# Patient Record
Sex: Male | Born: 1935 | Race: White | Hispanic: No | Smoking: Former smoker
Health system: Southern US, Community
[De-identification: ages and names within clinical notes are randomized; demographics above are authoritative.]

## PROBLEM LIST (undated history)

## (undated) DIAGNOSIS — J189 Pneumonia, unspecified organism: Secondary | ICD-10-CM

## (undated) DIAGNOSIS — J449 Chronic obstructive pulmonary disease, unspecified: Principal | ICD-10-CM

## (undated) DIAGNOSIS — K5901 Slow transit constipation: Secondary | ICD-10-CM

## (undated) DIAGNOSIS — M1710 Unilateral primary osteoarthritis, unspecified knee: Secondary | ICD-10-CM

## (undated) DIAGNOSIS — R609 Edema, unspecified: Secondary | ICD-10-CM

## (undated) DIAGNOSIS — C801 Malignant (primary) neoplasm, unspecified: Secondary | ICD-10-CM

## (undated) DIAGNOSIS — M1990 Unspecified osteoarthritis, unspecified site: Secondary | ICD-10-CM

## (undated) DIAGNOSIS — G4700 Insomnia, unspecified: Secondary | ICD-10-CM

## (undated) DIAGNOSIS — G909 Disorder of the autonomic nervous system, unspecified: Secondary | ICD-10-CM

## (undated) DIAGNOSIS — I10 Essential (primary) hypertension: Secondary | ICD-10-CM

## (undated) DIAGNOSIS — C61 Malignant neoplasm of prostate: Secondary | ICD-10-CM

## (undated) DIAGNOSIS — C8589 Other specified types of non-Hodgkin lymphoma, extranodal and solid organ sites: Secondary | ICD-10-CM

## (undated) DIAGNOSIS — D518 Other vitamin B12 deficiency anemias: Secondary | ICD-10-CM

## (undated) DIAGNOSIS — R222 Localized swelling, mass and lump, trunk: Secondary | ICD-10-CM

## (undated) HISTORY — DX: Insomnia, unspecified: G47.00

## (undated) HISTORY — DX: Slow transit constipation: K59.01

## (undated) HISTORY — DX: Other specified types of non-hodgkin lymphoma, extranodal and solid organ sites: C85.89

## (undated) HISTORY — DX: Edema, unspecified: R60.9

## (undated) HISTORY — DX: Other vitamin B12 deficiency anemias: D51.8

## (undated) HISTORY — DX: Localized swelling, mass and lump, trunk: R22.2

## (undated) HISTORY — DX: Malignant (primary) neoplasm, unspecified: C80.1

## (undated) HISTORY — DX: Disorder of the autonomic nervous system, unspecified: G90.9

## (undated) HISTORY — DX: Chronic obstructive pulmonary disease, unspecified: J44.9

## (undated) HISTORY — DX: Unilateral primary osteoarthritis, unspecified knee: M17.10

## (undated) HISTORY — DX: Unspecified osteoarthritis, unspecified site: M19.90

## (undated) HISTORY — PX: HEMORRHOID SURGERY: SHX153

## (undated) HISTORY — PX: CATARACT EXTRACTION, BILATERAL: SHX1313

## (undated) HISTORY — PX: OTHER SURGICAL HISTORY: SHX169

## (undated) HISTORY — DX: Essential (primary) hypertension: I10

---

## 1997-08-03 ENCOUNTER — Encounter: Admission: RE | Admit: 1997-08-03 | Discharge: 1997-11-01 | Payer: Self-pay | Admitting: Internal Medicine

## 2004-03-21 ENCOUNTER — Ambulatory Visit: Payer: Self-pay | Admitting: Internal Medicine

## 2004-04-04 ENCOUNTER — Ambulatory Visit: Payer: Self-pay | Admitting: Family Medicine

## 2004-06-16 ENCOUNTER — Ambulatory Visit: Payer: Self-pay | Admitting: Internal Medicine

## 2004-06-19 ENCOUNTER — Emergency Department (HOSPITAL_COMMUNITY): Admission: EM | Admit: 2004-06-19 | Discharge: 2004-06-19 | Payer: Self-pay | Admitting: Family Medicine

## 2004-06-20 ENCOUNTER — Ambulatory Visit: Payer: Self-pay | Admitting: Internal Medicine

## 2004-06-29 ENCOUNTER — Ambulatory Visit: Payer: Self-pay | Admitting: Internal Medicine

## 2004-07-04 ENCOUNTER — Ambulatory Visit: Payer: Self-pay | Admitting: Internal Medicine

## 2004-07-06 ENCOUNTER — Ambulatory Visit: Payer: Self-pay | Admitting: Internal Medicine

## 2004-07-21 ENCOUNTER — Ambulatory Visit: Payer: Self-pay | Admitting: Internal Medicine

## 2004-09-13 ENCOUNTER — Ambulatory Visit: Payer: Self-pay | Admitting: Internal Medicine

## 2004-11-14 ENCOUNTER — Ambulatory Visit: Payer: Self-pay | Admitting: Internal Medicine

## 2005-01-16 ENCOUNTER — Ambulatory Visit: Payer: Self-pay | Admitting: Internal Medicine

## 2005-02-21 ENCOUNTER — Ambulatory Visit: Payer: Self-pay | Admitting: Internal Medicine

## 2005-03-06 ENCOUNTER — Ambulatory Visit (HOSPITAL_COMMUNITY): Admission: RE | Admit: 2005-03-06 | Discharge: 2005-03-06 | Payer: Self-pay | Admitting: Urology

## 2005-04-17 ENCOUNTER — Ambulatory Visit: Payer: Self-pay | Admitting: Internal Medicine

## 2005-07-17 ENCOUNTER — Ambulatory Visit: Payer: Self-pay | Admitting: Internal Medicine

## 2005-10-12 ENCOUNTER — Ambulatory Visit: Payer: Self-pay | Admitting: Internal Medicine

## 2005-12-12 ENCOUNTER — Ambulatory Visit: Payer: Self-pay | Admitting: Internal Medicine

## 2006-02-15 ENCOUNTER — Ambulatory Visit: Payer: Self-pay | Admitting: Internal Medicine

## 2006-04-17 ENCOUNTER — Ambulatory Visit: Payer: Self-pay | Admitting: Internal Medicine

## 2006-06-20 ENCOUNTER — Ambulatory Visit: Payer: Self-pay | Admitting: Internal Medicine

## 2006-08-22 ENCOUNTER — Ambulatory Visit: Payer: Self-pay | Admitting: Internal Medicine

## 2006-11-26 DIAGNOSIS — I10 Essential (primary) hypertension: Secondary | ICD-10-CM | POA: Insufficient documentation

## 2006-11-26 HISTORY — DX: Essential (primary) hypertension: I10

## 2006-12-03 ENCOUNTER — Encounter: Payer: Self-pay | Admitting: Internal Medicine

## 2006-12-06 ENCOUNTER — Ambulatory Visit: Payer: Self-pay | Admitting: Internal Medicine

## 2006-12-06 DIAGNOSIS — J4489 Other specified chronic obstructive pulmonary disease: Secondary | ICD-10-CM | POA: Insufficient documentation

## 2006-12-06 DIAGNOSIS — D518 Other vitamin B12 deficiency anemias: Secondary | ICD-10-CM

## 2006-12-06 DIAGNOSIS — M1990 Unspecified osteoarthritis, unspecified site: Secondary | ICD-10-CM | POA: Insufficient documentation

## 2006-12-06 DIAGNOSIS — J449 Chronic obstructive pulmonary disease, unspecified: Secondary | ICD-10-CM

## 2006-12-06 DIAGNOSIS — Z8546 Personal history of malignant neoplasm of prostate: Secondary | ICD-10-CM

## 2006-12-06 HISTORY — DX: Other specified chronic obstructive pulmonary disease: J44.89

## 2006-12-06 HISTORY — DX: Unspecified osteoarthritis, unspecified site: M19.90

## 2006-12-06 HISTORY — DX: Chronic obstructive pulmonary disease, unspecified: J44.9

## 2006-12-06 HISTORY — DX: Other vitamin B12 deficiency anemias: D51.8

## 2006-12-12 ENCOUNTER — Ambulatory Visit: Payer: Self-pay | Admitting: Internal Medicine

## 2006-12-12 DIAGNOSIS — M1710 Unilateral primary osteoarthritis, unspecified knee: Secondary | ICD-10-CM | POA: Insufficient documentation

## 2006-12-12 HISTORY — DX: Unilateral primary osteoarthritis, unspecified knee: M17.10

## 2006-12-19 ENCOUNTER — Ambulatory Visit: Payer: Self-pay | Admitting: Internal Medicine

## 2007-01-02 ENCOUNTER — Ambulatory Visit: Payer: Self-pay | Admitting: Internal Medicine

## 2007-02-19 ENCOUNTER — Ambulatory Visit: Payer: Self-pay | Admitting: Internal Medicine

## 2007-04-09 ENCOUNTER — Encounter: Payer: Self-pay | Admitting: Internal Medicine

## 2007-05-13 ENCOUNTER — Telehealth: Payer: Self-pay | Admitting: Internal Medicine

## 2007-05-13 ENCOUNTER — Emergency Department (HOSPITAL_COMMUNITY): Admission: EM | Admit: 2007-05-13 | Discharge: 2007-05-13 | Payer: Self-pay | Admitting: Emergency Medicine

## 2007-05-22 ENCOUNTER — Ambulatory Visit: Payer: Self-pay | Admitting: Internal Medicine

## 2007-05-22 DIAGNOSIS — G4700 Insomnia, unspecified: Secondary | ICD-10-CM

## 2007-05-22 DIAGNOSIS — K5901 Slow transit constipation: Secondary | ICD-10-CM

## 2007-05-22 HISTORY — DX: Slow transit constipation: K59.01

## 2007-05-22 HISTORY — DX: Insomnia, unspecified: G47.00

## 2007-05-23 LAB — CONVERTED CEMR LAB
BUN: 17 mg/dL (ref 6–23)
Basophils Absolute: 0.1 10*3/uL (ref 0.0–0.1)
Basophils Relative: 0.7 % (ref 0.0–1.0)
CO2: 24 meq/L (ref 19–32)
Calcium: 9.6 mg/dL (ref 8.4–10.5)
Chloride: 107 meq/L (ref 96–112)
Creatinine, Ser: 1.4 mg/dL (ref 0.4–1.5)
Eosinophils Absolute: 0.2 10*3/uL (ref 0.0–0.6)
Eosinophils Relative: 2.4 % (ref 0.0–5.0)
GFR calc Af Amer: 61 mL/min
GFR calc non Af Amer: 50 mL/min
Glucose, Bld: 98 mg/dL (ref 70–99)
HCT: 35 % — ABNORMAL LOW (ref 39.0–52.0)
Hemoglobin: 12.5 g/dL — ABNORMAL LOW (ref 13.0–17.0)
Lymphocytes Relative: 24.1 % (ref 12.0–46.0)
MCHC: 35.8 g/dL (ref 30.0–36.0)
MCV: 92.4 fL (ref 78.0–100.0)
Monocytes Absolute: 0.9 10*3/uL — ABNORMAL HIGH (ref 0.2–0.7)
Monocytes Relative: 12.1 % — ABNORMAL HIGH (ref 3.0–11.0)
Neutro Abs: 4.5 10*3/uL (ref 1.4–7.7)
Neutrophils Relative %: 60.7 % (ref 43.0–77.0)
Platelets: 277 10*3/uL (ref 150–400)
Potassium: 5 meq/L (ref 3.5–5.1)
RBC: 3.78 M/uL — ABNORMAL LOW (ref 4.22–5.81)
RDW: 14.1 % (ref 11.5–14.6)
Sodium: 140 meq/L (ref 135–145)
WBC: 7.5 10*3/uL (ref 4.5–10.5)

## 2007-05-28 ENCOUNTER — Telehealth (INDEPENDENT_AMBULATORY_CARE_PROVIDER_SITE_OTHER): Payer: Self-pay | Admitting: *Deleted

## 2007-05-29 ENCOUNTER — Telehealth: Payer: Self-pay | Admitting: Internal Medicine

## 2007-05-30 ENCOUNTER — Telehealth (INDEPENDENT_AMBULATORY_CARE_PROVIDER_SITE_OTHER): Payer: Self-pay | Admitting: *Deleted

## 2007-05-31 ENCOUNTER — Encounter: Admission: RE | Admit: 2007-05-31 | Discharge: 2007-05-31 | Payer: Self-pay | Admitting: Internal Medicine

## 2007-06-03 ENCOUNTER — Telehealth: Payer: Self-pay | Admitting: *Deleted

## 2007-06-04 ENCOUNTER — Encounter: Payer: Self-pay | Admitting: Internal Medicine

## 2007-06-05 ENCOUNTER — Ambulatory Visit: Payer: Self-pay | Admitting: Oncology

## 2007-06-10 ENCOUNTER — Ambulatory Visit (HOSPITAL_COMMUNITY): Admission: RE | Admit: 2007-06-10 | Discharge: 2007-06-10 | Payer: Self-pay | Admitting: Urology

## 2007-06-10 ENCOUNTER — Encounter (INDEPENDENT_AMBULATORY_CARE_PROVIDER_SITE_OTHER): Payer: Self-pay | Admitting: Urology

## 2007-06-10 ENCOUNTER — Encounter (INDEPENDENT_AMBULATORY_CARE_PROVIDER_SITE_OTHER): Payer: Self-pay | Admitting: Interventional Radiology

## 2007-06-11 ENCOUNTER — Encounter: Payer: Self-pay | Admitting: Internal Medicine

## 2007-06-13 ENCOUNTER — Encounter: Payer: Self-pay | Admitting: Internal Medicine

## 2007-06-17 ENCOUNTER — Ambulatory Visit (HOSPITAL_COMMUNITY): Admission: RE | Admit: 2007-06-17 | Discharge: 2007-06-17 | Payer: Self-pay | Admitting: Oncology

## 2007-06-18 LAB — CBC WITH DIFFERENTIAL/PLATELET
Basophils Absolute: 0 10*3/uL (ref 0.0–0.1)
Eosinophils Absolute: 0.2 10*3/uL (ref 0.0–0.5)
HCT: 31.8 % — ABNORMAL LOW (ref 38.7–49.9)
HGB: 11.2 g/dL — ABNORMAL LOW (ref 13.0–17.1)
LYMPH%: 22.4 % (ref 14.0–48.0)
MCV: 91.7 fL (ref 81.6–98.0)
MONO#: 0.8 10*3/uL (ref 0.1–0.9)
MONO%: 12.4 % (ref 0.0–13.0)
NEUT#: 3.9 10*3/uL (ref 1.5–6.5)
Platelets: 271 10*3/uL (ref 145–400)

## 2007-06-18 LAB — COMPREHENSIVE METABOLIC PANEL
Albumin: 2.9 g/dL — ABNORMAL LOW (ref 3.5–5.2)
BUN: 27 mg/dL — ABNORMAL HIGH (ref 6–23)
Calcium: 9.6 mg/dL (ref 8.4–10.5)
Chloride: 105 mEq/L (ref 96–112)
Creatinine, Ser: 1.86 mg/dL — ABNORMAL HIGH (ref 0.40–1.50)
Glucose, Bld: 101 mg/dL — ABNORMAL HIGH (ref 70–99)
Potassium: 5.1 mEq/L (ref 3.5–5.3)

## 2007-06-19 LAB — HEPATITIS B SURFACE ANTIBODY,QUALITATIVE: Hep B S Ab: NEGATIVE

## 2007-06-19 LAB — HEPATITIS B SURFACE ANTIGEN: Hepatitis B Surface Ag: NEGATIVE

## 2007-07-01 ENCOUNTER — Encounter: Payer: Self-pay | Admitting: Internal Medicine

## 2007-07-01 LAB — CBC WITH DIFFERENTIAL/PLATELET
BASO%: 0.9 % (ref 0.0–2.0)
EOS%: 4.2 % (ref 0.0–7.0)
HCT: 32.5 % — ABNORMAL LOW (ref 38.7–49.9)
LYMPH%: 30.8 % (ref 14.0–48.0)
MCH: 32.1 pg (ref 28.0–33.4)
MCHC: 35.6 g/dL (ref 32.0–35.9)
MCV: 90.2 fL (ref 81.6–98.0)
MONO%: 21.7 % — ABNORMAL HIGH (ref 0.0–13.0)
NEUT%: 42.4 % (ref 40.0–75.0)
Platelets: 313 10*3/uL (ref 145–400)

## 2007-07-01 LAB — COMPREHENSIVE METABOLIC PANEL
ALT: 21 U/L (ref 0–53)
AST: 19 U/L (ref 0–37)
Creatinine, Ser: 1.67 mg/dL — ABNORMAL HIGH (ref 0.40–1.50)
Total Bilirubin: 0.3 mg/dL (ref 0.3–1.2)

## 2007-07-05 ENCOUNTER — Encounter: Payer: Self-pay | Admitting: Internal Medicine

## 2007-07-05 LAB — CBC WITH DIFFERENTIAL/PLATELET
Basophils Absolute: 0 10*3/uL (ref 0.0–0.1)
EOS%: 2.5 % (ref 0.0–7.0)
Eosinophils Absolute: 0.1 10*3/uL (ref 0.0–0.5)
HCT: 31.8 % — ABNORMAL LOW (ref 38.7–49.9)
HGB: 11.1 g/dL — ABNORMAL LOW (ref 13.0–17.1)
MCH: 31.8 pg (ref 28.0–33.4)
MCV: 90.6 fL (ref 81.6–98.0)
NEUT#: 0.7 10*3/uL — ABNORMAL LOW (ref 1.5–6.5)
NEUT%: 27.6 % — ABNORMAL LOW (ref 40.0–75.0)
lymph#: 0.9 10*3/uL (ref 0.9–3.3)

## 2007-07-11 LAB — CBC WITH DIFFERENTIAL/PLATELET
Basophils Absolute: 0.1 10*3/uL (ref 0.0–0.1)
EOS%: 1.3 % (ref 0.0–7.0)
Eosinophils Absolute: 0.1 10*3/uL (ref 0.0–0.5)
HGB: 12.2 g/dL — ABNORMAL LOW (ref 13.0–17.1)
LYMPH%: 18.3 % (ref 14.0–48.0)
MCH: 31.4 pg (ref 28.0–33.4)
MCV: 91.1 fL (ref 81.6–98.0)
MONO%: 11.7 % (ref 0.0–13.0)
NEUT%: 67.5 % (ref 40.0–75.0)
Platelets: 241 10*3/uL (ref 145–400)
RDW: 14 % (ref 11.2–14.6)

## 2007-07-11 LAB — COMPREHENSIVE METABOLIC PANEL
ALT: 22 U/L (ref 0–53)
Alkaline Phosphatase: 64 U/L (ref 39–117)
Glucose, Bld: 112 mg/dL — ABNORMAL HIGH (ref 70–99)
Sodium: 132 mEq/L — ABNORMAL LOW (ref 135–145)
Total Bilirubin: 1.2 mg/dL (ref 0.3–1.2)
Total Protein: 6.6 g/dL (ref 6.0–8.3)

## 2007-07-22 ENCOUNTER — Ambulatory Visit: Payer: Self-pay | Admitting: Oncology

## 2007-07-24 ENCOUNTER — Encounter: Payer: Self-pay | Admitting: Internal Medicine

## 2007-07-24 LAB — COMPREHENSIVE METABOLIC PANEL
Alkaline Phosphatase: 57 U/L (ref 39–117)
BUN: 12 mg/dL (ref 6–23)
Creatinine, Ser: 1.39 mg/dL (ref 0.40–1.50)
Glucose, Bld: 98 mg/dL (ref 70–99)
Sodium: 137 mEq/L (ref 135–145)
Total Bilirubin: 1.2 mg/dL (ref 0.3–1.2)

## 2007-07-24 LAB — CBC WITH DIFFERENTIAL/PLATELET
Basophils Absolute: 0 10*3/uL (ref 0.0–0.1)
Eosinophils Absolute: 0.1 10*3/uL (ref 0.0–0.5)
LYMPH%: 19 % (ref 14.0–48.0)
MCV: 89.8 fL (ref 81.6–98.0)
MONO%: 20 % — ABNORMAL HIGH (ref 0.0–13.0)
NEUT#: 2.1 10*3/uL (ref 1.5–6.5)
NEUT%: 57.5 % (ref 40.0–75.0)
Platelets: 280 10*3/uL (ref 145–400)
RBC: 3.76 10*6/uL — ABNORMAL LOW (ref 4.20–5.71)

## 2007-08-01 LAB — CBC WITH DIFFERENTIAL/PLATELET
BASO%: 0.9 % (ref 0.0–2.0)
EOS%: 1.6 % (ref 0.0–7.0)
HCT: 35.1 % — ABNORMAL LOW (ref 38.7–49.9)
MCH: 32.4 pg (ref 28.0–33.4)
MCHC: 34.7 g/dL (ref 32.0–35.9)
MONO#: 1.2 10*3/uL — ABNORMAL HIGH (ref 0.1–0.9)
NEUT%: 68.2 % (ref 40.0–75.0)
RBC: 3.76 10*6/uL — ABNORMAL LOW (ref 4.20–5.71)
RDW: 16.1 % — ABNORMAL HIGH (ref 11.2–14.6)
WBC: 9.3 10*3/uL (ref 4.0–10.0)
lymph#: 1.6 10*3/uL (ref 0.9–3.3)

## 2007-08-12 ENCOUNTER — Encounter: Payer: Self-pay | Admitting: Internal Medicine

## 2007-08-15 ENCOUNTER — Ambulatory Visit (HOSPITAL_COMMUNITY): Admission: RE | Admit: 2007-08-15 | Discharge: 2007-08-15 | Payer: Self-pay | Admitting: Oncology

## 2007-08-20 ENCOUNTER — Encounter: Payer: Self-pay | Admitting: Internal Medicine

## 2007-08-20 LAB — COMPREHENSIVE METABOLIC PANEL
ALT: 18 U/L (ref 0–53)
AST: 22 U/L (ref 0–37)
Calcium: 9.5 mg/dL (ref 8.4–10.5)
Chloride: 110 mEq/L (ref 96–112)
Creatinine, Ser: 1.31 mg/dL (ref 0.40–1.50)
Total Bilirubin: 0.9 mg/dL (ref 0.3–1.2)

## 2007-08-20 LAB — CBC WITH DIFFERENTIAL/PLATELET
BASO%: 0.1 % (ref 0.0–2.0)
Basophils Absolute: 0 10*3/uL (ref 0.0–0.1)
EOS%: 0.9 % (ref 0.0–7.0)
HCT: 35 % — ABNORMAL LOW (ref 38.7–49.9)
HGB: 12.4 g/dL — ABNORMAL LOW (ref 13.0–17.1)
MCH: 32.1 pg (ref 28.0–33.4)
MONO#: 1.2 10*3/uL — ABNORMAL HIGH (ref 0.1–0.9)
NEUT%: 74.3 % (ref 40.0–75.0)
RDW: 16.8 % — ABNORMAL HIGH (ref 11.2–14.6)
WBC: 10.3 10*3/uL — ABNORMAL HIGH (ref 4.0–10.0)
lymph#: 1.4 10*3/uL (ref 0.9–3.3)

## 2007-09-04 ENCOUNTER — Encounter: Payer: Self-pay | Admitting: Internal Medicine

## 2007-09-04 DIAGNOSIS — C8589 Other specified types of non-Hodgkin lymphoma, extranodal and solid organ sites: Secondary | ICD-10-CM

## 2007-09-04 HISTORY — DX: Other specified types of non-hodgkin lymphoma, extranodal and solid organ sites: C85.89

## 2007-09-06 ENCOUNTER — Ambulatory Visit: Payer: Self-pay | Admitting: Oncology

## 2007-09-10 ENCOUNTER — Inpatient Hospital Stay (HOSPITAL_COMMUNITY): Admission: AD | Admit: 2007-09-10 | Discharge: 2007-09-12 | Payer: Self-pay | Admitting: Oncology

## 2007-09-10 ENCOUNTER — Ambulatory Visit: Payer: Self-pay | Admitting: Oncology

## 2007-09-10 ENCOUNTER — Encounter: Payer: Self-pay | Admitting: Internal Medicine

## 2007-09-10 LAB — CBC WITH DIFFERENTIAL/PLATELET
BASO%: 0.7 % (ref 0.0–2.0)
EOS%: 0.6 % (ref 0.0–7.0)
HCT: 33.4 % — ABNORMAL LOW (ref 38.7–49.9)
LYMPH%: 16.6 % (ref 14.0–48.0)
MCH: 32.1 pg (ref 28.0–33.4)
MCHC: 35.6 g/dL (ref 32.0–35.9)
MONO#: 1.4 10*3/uL — ABNORMAL HIGH (ref 0.1–0.9)
MONO%: 13.5 % — ABNORMAL HIGH (ref 0.0–13.0)
NEUT%: 68.6 % (ref 40.0–75.0)
Platelets: 324 10*3/uL (ref 145–400)
RBC: 3.71 10*6/uL — ABNORMAL LOW (ref 4.20–5.71)
WBC: 10 10*3/uL (ref 4.0–10.0)

## 2007-09-18 ENCOUNTER — Encounter: Payer: Self-pay | Admitting: Internal Medicine

## 2007-10-04 ENCOUNTER — Encounter: Payer: Self-pay | Admitting: Internal Medicine

## 2007-10-04 LAB — BASIC METABOLIC PANEL
BUN: 23 mg/dL (ref 6–23)
CO2: 20 mEq/L (ref 19–32)
Glucose, Bld: 107 mg/dL — ABNORMAL HIGH (ref 70–99)
Potassium: 4.3 mEq/L (ref 3.5–5.3)

## 2007-10-29 ENCOUNTER — Ambulatory Visit: Payer: Self-pay | Admitting: Oncology

## 2007-10-31 LAB — BASIC METABOLIC PANEL
Calcium: 9.1 mg/dL (ref 8.4–10.5)
Creatinine, Ser: 1.4 mg/dL (ref 0.40–1.50)

## 2007-11-28 ENCOUNTER — Encounter: Payer: Self-pay | Admitting: Internal Medicine

## 2007-11-28 LAB — CBC WITH DIFFERENTIAL/PLATELET
Basophils Absolute: 0 10*3/uL (ref 0.0–0.1)
EOS%: 3.1 % (ref 0.0–7.0)
Eosinophils Absolute: 0.2 10*3/uL (ref 0.0–0.5)
HGB: 12.8 g/dL — ABNORMAL LOW (ref 13.0–17.1)
NEUT#: 3.6 10*3/uL (ref 1.5–6.5)
RBC: 3.8 10*6/uL — ABNORMAL LOW (ref 4.20–5.71)
RDW: 14.9 % — ABNORMAL HIGH (ref 11.2–14.6)
lymph#: 1.8 10*3/uL (ref 0.9–3.3)

## 2007-11-28 LAB — BASIC METABOLIC PANEL
BUN: 18 mg/dL (ref 6–23)
Chloride: 106 mEq/L (ref 96–112)
Glucose, Bld: 94 mg/dL (ref 70–99)
Potassium: 4.2 mEq/L (ref 3.5–5.3)
Sodium: 139 mEq/L (ref 135–145)

## 2007-12-16 ENCOUNTER — Ambulatory Visit: Payer: Self-pay | Admitting: Oncology

## 2007-12-17 ENCOUNTER — Encounter: Payer: Self-pay | Admitting: Internal Medicine

## 2007-12-23 ENCOUNTER — Encounter: Payer: Self-pay | Admitting: Internal Medicine

## 2008-02-03 ENCOUNTER — Ambulatory Visit: Payer: Self-pay | Admitting: Oncology

## 2008-02-03 ENCOUNTER — Encounter: Payer: Self-pay | Admitting: Internal Medicine

## 2008-02-11 ENCOUNTER — Ambulatory Visit: Payer: Self-pay | Admitting: Internal Medicine

## 2008-03-25 ENCOUNTER — Encounter: Admission: RE | Admit: 2008-03-25 | Discharge: 2008-03-25 | Payer: Self-pay | Admitting: Ophthalmology

## 2008-04-30 ENCOUNTER — Ambulatory Visit: Payer: Self-pay | Admitting: Oncology

## 2008-05-05 ENCOUNTER — Encounter: Payer: Self-pay | Admitting: Internal Medicine

## 2008-05-05 LAB — CBC WITH DIFFERENTIAL/PLATELET
Eosinophils Absolute: 0.4 10*3/uL (ref 0.0–0.5)
LYMPH%: 19.6 % (ref 14.0–48.0)
MONO#: 0.8 10*3/uL (ref 0.1–0.9)
NEUT#: 5.1 10*3/uL (ref 1.5–6.5)
Platelets: 238 10*3/uL (ref 145–400)
RBC: 4.06 10*6/uL — ABNORMAL LOW (ref 4.20–5.71)
WBC: 7.9 10*3/uL (ref 4.0–10.0)

## 2008-05-05 LAB — COMPREHENSIVE METABOLIC PANEL
Albumin: 3.8 g/dL (ref 3.5–5.2)
CO2: 24 mEq/L (ref 19–32)
Calcium: 9 mg/dL (ref 8.4–10.5)
Chloride: 109 mEq/L (ref 96–112)
Glucose, Bld: 93 mg/dL (ref 70–99)
Potassium: 4.4 mEq/L (ref 3.5–5.3)
Sodium: 141 mEq/L (ref 135–145)
Total Bilirubin: 0.3 mg/dL (ref 0.3–1.2)
Total Protein: 6.7 g/dL (ref 6.0–8.3)

## 2008-05-05 LAB — LACTATE DEHYDROGENASE: LDH: 118 U/L (ref 94–250)

## 2008-05-05 LAB — VITAMIN B12: Vitamin B-12: 274 pg/mL (ref 211–911)

## 2008-06-08 ENCOUNTER — Emergency Department (HOSPITAL_COMMUNITY): Admission: EM | Admit: 2008-06-08 | Discharge: 2008-06-08 | Payer: Self-pay | Admitting: Emergency Medicine

## 2008-06-08 ENCOUNTER — Telehealth: Payer: Self-pay | Admitting: Internal Medicine

## 2008-06-08 ENCOUNTER — Ambulatory Visit: Payer: Self-pay | Admitting: Internal Medicine

## 2008-06-08 ENCOUNTER — Encounter: Payer: Self-pay | Admitting: Internal Medicine

## 2008-06-08 DIAGNOSIS — R109 Unspecified abdominal pain: Secondary | ICD-10-CM | POA: Insufficient documentation

## 2008-06-11 ENCOUNTER — Ambulatory Visit: Payer: Self-pay | Admitting: Internal Medicine

## 2008-07-08 ENCOUNTER — Ambulatory Visit: Payer: Self-pay | Admitting: Oncology

## 2008-08-18 ENCOUNTER — Encounter: Payer: Self-pay | Admitting: Internal Medicine

## 2008-10-27 ENCOUNTER — Ambulatory Visit: Payer: Self-pay | Admitting: Internal Medicine

## 2008-10-27 DIAGNOSIS — G990 Autonomic neuropathy in diseases classified elsewhere: Secondary | ICD-10-CM | POA: Insufficient documentation

## 2008-10-27 DIAGNOSIS — G909 Disorder of the autonomic nervous system, unspecified: Secondary | ICD-10-CM

## 2008-10-27 HISTORY — DX: Disorder of the autonomic nervous system, unspecified: G90.9

## 2008-10-28 LAB — CONVERTED CEMR LAB
Basophils Relative: 0.3 % (ref 0.0–3.0)
Eosinophils Absolute: 0.2 10*3/uL (ref 0.0–0.7)
Eosinophils Relative: 3.5 % (ref 0.0–5.0)
Folate: 6.7 ng/mL
HCT: 40.4 % (ref 39.0–52.0)
Hemoglobin: 13.9 g/dL (ref 13.0–17.0)
Lymphocytes Relative: 36.5 % (ref 12.0–46.0)
Lymphs Abs: 2.4 10*3/uL (ref 0.7–4.0)
MCHC: 34.5 g/dL (ref 30.0–36.0)
MCV: 95.3 fL (ref 78.0–100.0)
Monocytes Absolute: 0.7 10*3/uL (ref 0.1–1.0)
Monocytes Relative: 10.5 % (ref 3.0–12.0)
Neutro Abs: 3.4 10*3/uL (ref 1.4–7.7)
Neutrophils Relative %: 49.2 % (ref 43.0–77.0)
Platelets: 186 10*3/uL (ref 150.0–400.0)
RBC: 4.24 M/uL (ref 4.22–5.81)
RDW: 13.9 % (ref 11.5–14.6)
Vitamin B-12: 222 pg/mL (ref 211–911)
WBC: 6.7 10*3/uL (ref 4.5–10.5)

## 2008-11-20 ENCOUNTER — Ambulatory Visit: Payer: Self-pay | Admitting: Oncology

## 2008-11-26 ENCOUNTER — Ambulatory Visit: Payer: Self-pay | Admitting: Internal Medicine

## 2008-12-25 ENCOUNTER — Encounter: Payer: Self-pay | Admitting: Internal Medicine

## 2008-12-29 ENCOUNTER — Encounter: Payer: Self-pay | Admitting: Internal Medicine

## 2009-03-24 ENCOUNTER — Ambulatory Visit: Payer: Self-pay | Admitting: Oncology

## 2009-03-29 ENCOUNTER — Encounter: Payer: Self-pay | Admitting: Internal Medicine

## 2009-04-29 ENCOUNTER — Encounter: Payer: Self-pay | Admitting: Internal Medicine

## 2009-08-31 ENCOUNTER — Ambulatory Visit: Payer: Self-pay | Admitting: Family Medicine

## 2009-09-07 ENCOUNTER — Encounter: Payer: Self-pay | Admitting: Internal Medicine

## 2009-09-22 ENCOUNTER — Ambulatory Visit: Payer: Self-pay | Admitting: Oncology

## 2009-09-23 ENCOUNTER — Encounter: Payer: Self-pay | Admitting: Internal Medicine

## 2009-09-23 LAB — CBC WITH DIFFERENTIAL/PLATELET
BASO%: 0.6 % (ref 0.0–2.0)
Basophils Absolute: 0 10*3/uL (ref 0.0–0.1)
EOS%: 4.4 % (ref 0.0–7.0)
Eosinophils Absolute: 0.3 10*3/uL (ref 0.0–0.5)
HCT: 34.9 % — ABNORMAL LOW (ref 38.4–49.9)
HGB: 12.3 g/dL — ABNORMAL LOW (ref 13.0–17.1)
LYMPH%: 28.3 % (ref 14.0–49.0)
MCH: 33.7 pg — ABNORMAL HIGH (ref 27.2–33.4)
MCV: 95.8 fL (ref 79.3–98.0)
MONO%: 10.7 % (ref 0.0–14.0)
NEUT#: 3.7 10*3/uL (ref 1.5–6.5)
RBC: 3.64 10*6/uL — ABNORMAL LOW (ref 4.20–5.82)
RDW: 14.6 % (ref 11.0–14.6)
lymph#: 1.9 10*3/uL (ref 0.9–3.3)

## 2009-09-23 LAB — VITAMIN B12: Vitamin B-12: 496 pg/mL (ref 211–911)

## 2009-09-28 ENCOUNTER — Ambulatory Visit: Payer: Self-pay | Admitting: Family Medicine

## 2009-09-28 DIAGNOSIS — R609 Edema, unspecified: Secondary | ICD-10-CM

## 2009-09-28 HISTORY — DX: Edema, unspecified: R60.9

## 2009-09-29 LAB — CONVERTED CEMR LAB
ALT: 23 units/L (ref 0–53)
AST: 23 units/L (ref 0–37)
Albumin: 3.3 g/dL — ABNORMAL LOW (ref 3.5–5.2)
Alkaline Phosphatase: 66 units/L (ref 39–117)
BUN: 25 mg/dL — ABNORMAL HIGH (ref 6–23)
Basophils Absolute: 0 10*3/uL (ref 0.0–0.1)
Basophils Relative: 0.7 % (ref 0.0–3.0)
Bilirubin, Direct: 0.1 mg/dL (ref 0.0–0.3)
CO2: 27 meq/L (ref 19–32)
Calcium: 9.3 mg/dL (ref 8.4–10.5)
Chloride: 104 meq/L (ref 96–112)
Creatinine, Ser: 1.5 mg/dL (ref 0.4–1.5)
Eosinophils Absolute: 0.2 10*3/uL (ref 0.0–0.7)
Eosinophils Relative: 2.7 % (ref 0.0–5.0)
GFR calc non Af Amer: 47.71 mL/min (ref >60)
Glucose, Bld: 99 mg/dL (ref 70–99)
HCT: 35.6 % — ABNORMAL LOW (ref 39.0–52.0)
Hemoglobin: 12.1 g/dL — ABNORMAL LOW (ref 13.0–17.0)
Lymphocytes Relative: 25.7 % (ref 12.0–46.0)
Lymphs Abs: 1.9 10*3/uL (ref 0.7–4.0)
MCHC: 34.1 g/dL (ref 30.0–36.0)
MCV: 97.6 fL (ref 78.0–100.0)
Monocytes Absolute: 0.8 10*3/uL (ref 0.1–1.0)
Monocytes Relative: 10.5 % (ref 3.0–12.0)
Neutro Abs: 4.4 10*3/uL (ref 1.4–7.7)
Neutrophils Relative %: 60.4 % (ref 43.0–77.0)
Platelets: 219 10*3/uL (ref 150.0–400.0)
Potassium: 4.5 meq/L (ref 3.5–5.1)
Pro B Natriuretic peptide (BNP): 29.9 pg/mL (ref 0.0–100.0)
RBC: 3.65 M/uL — ABNORMAL LOW (ref 4.22–5.81)
RDW: 14.8 % — ABNORMAL HIGH (ref 11.5–14.6)
Sodium: 139 meq/L (ref 135–145)
TSH: 1.84 microintl units/mL (ref 0.35–5.50)
Total Bilirubin: 0.7 mg/dL (ref 0.3–1.2)
Total Protein: 6 g/dL (ref 6.0–8.3)
WBC: 7.3 10*3/uL (ref 4.5–10.5)

## 2009-09-30 ENCOUNTER — Telehealth: Payer: Self-pay | Admitting: Internal Medicine

## 2009-10-07 ENCOUNTER — Ambulatory Visit: Payer: Self-pay | Admitting: Internal Medicine

## 2009-10-07 DIAGNOSIS — R222 Localized swelling, mass and lump, trunk: Secondary | ICD-10-CM | POA: Insufficient documentation

## 2009-10-07 HISTORY — DX: Localized swelling, mass and lump, trunk: R22.2

## 2009-10-13 ENCOUNTER — Telehealth: Payer: Self-pay | Admitting: Internal Medicine

## 2009-11-03 ENCOUNTER — Ambulatory Visit: Payer: Self-pay | Admitting: Oncology

## 2009-11-05 ENCOUNTER — Encounter: Payer: Self-pay | Admitting: Internal Medicine

## 2010-01-11 ENCOUNTER — Ambulatory Visit: Payer: Self-pay | Admitting: Internal Medicine

## 2010-01-20 ENCOUNTER — Ambulatory Visit: Payer: Self-pay | Admitting: Oncology

## 2010-01-24 ENCOUNTER — Encounter: Payer: Self-pay | Admitting: Internal Medicine

## 2010-02-24 ENCOUNTER — Encounter: Payer: Self-pay | Admitting: Internal Medicine

## 2010-06-02 NOTE — Letter (Signed)
Summary: [facility]  [facility]   Imported By: Maryln Gottron 02/17/2010 13:57:43  _____________________________________________________________________  External Attachment:    Type:   Image     Comment:   External Document

## 2010-06-02 NOTE — Progress Notes (Signed)
Summary: please return call  Phone Note Call from Patient Call back at Home Phone [phone]   Caller: Karolee Stamps martin-daughter------voice mail Reason for Call: Acute Illness, Talk to Nurse Summary of Call: wants Harriett Sine to call her regarding her father. no further message was left on vm. Initial call taken by: Warnell Forester,  September 30, 2009 10:02 AM  Follow-up for Phone Call        needs ov with dr Lovell Sheehan for 2 weeks/bmw Follow-up by: Willy Eddy, LPN,  October 01, 979 10:56 AM

## 2010-06-02 NOTE — Letter (Signed)
Summary: [facility]  [facility]   Imported By: Maryln Gottron 09/15/2009 09:46:57  _____________________________________________________________________  External Attachment:    Type:   Image     Comment:   External Document

## 2010-06-02 NOTE — Letter (Signed)
[facility]   Imported By: Maryln Gottron 12/06/2009 11:03:25  _____________________________________________________________________  External Attachment:    Type:   Image     Comment:   External Document

## 2010-06-02 NOTE — Letter (Signed)
[facility]   Imported By: Maryln Gottron 10/14/2009 09:29:41  _____________________________________________________________________  External Attachment:    Type:   Image     Comment:   External Document

## 2010-06-02 NOTE — Progress Notes (Signed)
Summary: temazepam & results xray  Phone Note Call from Patient   Caller: Daughter [phone] c, Caleen Essex Reason for Call: Talk to Doctor, Lab or Test Results Complaint: Urinary/GYN Problems Summary of Call: 1)II gave you the wrong sleeping med first of this week that you filled.  The one he actually takes is Temazepam 30mg .   This is the one he needs refilled.  It will be Friday before we will pick it up.  They told me Dr. Shela Commons may be kicking it back.   [facility].  You can just do away with the other one.  [place].   2) please call me on c with xray results where we are.   Initial call taken by: Rudy Jew, RN,  October 13, 2009 12:34 PM  Follow-up for Phone Call        cxr- stable- daughter told and will stop mirtazapine adn ok temazepine. Follow-up by: Willy Eddy, LPN,  October 13, 2009 1:20 PM    New/Updated Medications: TEMAZEPAM 30 MG CAPS (TEMAZEPAM) 1 at bedtime as needed Prescriptions: TEMAZEPAM 30 MG CAPS (TEMAZEPAM) 1 at bedtime as needed  #30 x 3   Entered by:   Willy Eddy, LPN   Authorized by:   Stacie Glaze MD   Signed by:   Willy Eddy, LPN on 45/40/9811   Method used:   Telephoned to ...       [facility]  [address] [id number]       [address]       [place], [place]  [address]       Ph: [phone]       Fax: [phone]   RxID:   [id number]   Appended Document: temazepam & results xray faxed refill request

## 2010-06-02 NOTE — Assessment & Plan Note (Signed)
Summary: flu shot/njr  Nurse Visit   Review of Systems       Flu Vaccine Consent Questions     Do you have a history of severe allergic reactions to this vaccine? no    Any prior history of allergic reactions to egg and/or gelatin? no    Do you have a sensitivity to the preservative Thimersol? no    Do you have a past history of Guillan-Barre Syndrome? no    Do you currently have an acute febrile illness? no    Have you ever had a severe reaction to latex? no    Vaccine information given and explained to patient? yes    Are you currently pregnant? no    Lot Number:[id number]   Exp Date:10/29/2010   Site Given  Left Deltoid IM    Allergies: No Known Drug Allergies  Orders Added: 1)  Flu Vaccine 26yrs + MEDICARE PATIENTS [Q2039] 2)  Administration Flu vaccine - MCR [G0008]

## 2010-06-02 NOTE — Letter (Signed)
Summary: [facility]  [facility]   Imported By: Maryln Gottron 05/20/2009 10:31:58  _____________________________________________________________________  External Attachment:    Type:   Image     Comment:   External Document

## 2010-06-02 NOTE — Assessment & Plan Note (Signed)
Summary: roa/bmw   Vital Signs:  Patient profile:   77 year old male Height:      68 inches Weight:      194 pounds BMI:     29.60 Temp:     98.2 degrees F oral Pulse rate:   60 / minute Resp:     14 per minute BP sitting:   146 / 74  (left arm)  Vitals Entered By: Willy Eddy, LPN (October 07, 1608 11:05 AM) CC: roa- had cxr that was suspicious- dr sherrill's office called and wants dr j to repeat cxr or have ct as suggested in xray, Hypertension Management   CC:  roa- had cxr that was suspicious- dr sherrill's office called and wants dr j to repeat cxr or have ct as suggested in xray and Hypertension Management.  History of Present Illness: The oncologist noted increased fluid retention and sent him to see Dr Caryl Never The labs nor xray showed any CHF there was a spot  that the radiolojist saw that could be infection of possible metastasis  Hypertension History:      He complains of dyspnea with exertion, orthopnea, PND, and peripheral edema, but denies headache, chest pain, palpitations, visual symptoms, neurologic problems, syncope, and side effects from treatment.        Positive major cardiovascular risk factors include male age 35 years old or older and hypertension.  Negative major cardiovascular risk factors include non-tobacco-user status.     Preventive Screening-Counseling & Management  Alcohol-Tobacco     Smoking Status: quit  Problems Prior to Update: 1)  Edema Leg  (ICD-782.3) 2)  Peripheral Autonomic Neuropathy D/o Class Elsw  (ICD-337.1) 3)  Abdominal Pain  (ICD-789.00) 4)  Non-hodgkin's Lymphoma  (ICD-202.80) 5)  Degenerative Joint Disease  (ICD-715.90) 6)  Insomnia, Chronic  (ICD-307.42) 7)  Slow Transit Constipation  (ICD-564.01) 8)  Osteoarthrosis, Local Nos, Lower Leg  (ICD-715.36) 9)  Anemia, B12 Deficiency  (ICD-281.1) 10)  Osteoarthritis  (ICD-715.90) 11)  COPD  (ICD-496) 12)  Prostate Cancer, Hx of  (ICD-V10.46) 13)  Hypertension   (ICD-401.9)  Current Problems (verified): 1)  Edema Leg  (ICD-782.3) 2)  Peripheral Autonomic Neuropathy D/o Class Elsw  (ICD-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 Once Daily 2)  Nabumetone 750 Mg  Tabs (Nabumetone) .... Two Times A Day 3)  Bayer Low Strength 81 Mg  Tbec (Aspirin) .... Once Daily 4)  Mirtazapine 15 Mg Tabs (Mirtazapine) .... Take 1 Tablet By Mouth At Bedtime 5)  Folbee 2.5-25-1 Mg Tabs (Folic Acid-Vit B6-Vit B12) .... One By Mouth Daily 6)  Gabapentin 100 Mg Caps (Gabapentin) .... One By Mouth Three Times A Day 7)  Cyanocobalamin 1000 Mcg/ml Soln (Cyanocobalamin) .Marland Kitchen.. 1ml Monthly 8)  Bd Eclipse Syringe 25g X 5/8" 3 Ml Misc (Syringe/needle (Disp)) .... Use Monthly With B12 Injections 9)  Levaquin 500 Mg Tabs (Levofloxacin) .... One Tab By Mouth Daily For 10 Days  Current Medications (verified): 1)  Calcium 500/d 500-125 Mg-Unit  Tabs (Calcium Carbonate-Vitamin  D) .... Once Daily 2)  Nabumetone 750 Mg  Tabs (Nabumetone) .... Two Times A Day 3)  Bayer Low Strength 81 Mg  Tbec (Aspirin) .... Once Daily 4)  Mirtazapine 15 Mg Tabs (Mirtazapine) .... Take 1 Tablet By Mouth At Bedtime 5)  Folbee 2.5-25-1 Mg Tabs (Folic Acid-Vit B6-Vit B12) .... One By Mouth Daily 6)  Gabapentin 100 Mg Caps (Gabapentin) .... One By Mouth Three Times A Day 7)  Cyanocobalamin 1000 Mcg/ml Soln (Cyanocobalamin) .Marland Kitchen.. 1ml Monthly 8)  Bd Eclipse Syringe 25g X 5/8" 3 Ml Misc (Syringe/needle (Disp)) .... Use  Monthly With B12 Injections 9)  Levaquin 500 Mg Tabs (Levofloxacin) .... One Tab By Mouth Daily For 10 Days  Allergies (verified): No Known Drug Allergies  Past History:  Family History: Last updated: January 03, 2007 father died from cancer mother died from "age" at 101  Social History: Last updated: 09/28/2009 Former Smoker Alcohol use-no Drug use-no Wife deceased 2009/10/07 family checks on them frequently  Risk Factors: Smoking Status: quit (10/07/2009)  Past medical, surgical, family and social histories (including risk factors) reviewed, and no changes noted (except as noted below).  Past Medical History: Reviewed history from 06/08/2008 and no changes required. Hypertension Prostate cancer, hx of COPD Osteoarthritis constipation NHL---follwed by dr Truett Perna  Past Surgical History: Reviewed history from 03-Jan-2007 and no changes required. Hemorrhoidectomy  Family History: Reviewed history from 2007/01/03 and no changes required. father died from cancer mother died from "age" at 75  Social History: Reviewed history from 09/28/2009 and no changes required. Former Smoker Alcohol use-no Drug use-no Wife deceased 10-07-09 family checks on them frequently  Review of Systems       The patient complains of chest pain, dyspnea on exertion, and peripheral edema.  The patient denies anorexia, fever, weight loss, weight gain, vision loss, decreased hearing, hoarseness, syncope, prolonged cough, headaches, hemoptysis, abdominal pain, melena, hematochezia, severe indigestion/heartburn, hematuria, incontinence, genital sores, muscle weakness, suspicious skin lesions, transient blindness, difficulty walking, depression, unusual weight change, abnormal bleeding, enlarged lymph nodes, angioedema, and breast masses.    Physical Exam  General:  alert and pale.   Head:  normocephalic and male-pattern balding.   Eyes:  pupils equal and pupils round.   Ears:  L ear normal and no external  deformities.   Neck:  No deformities, masses, or tenderness noted. Lungs:  normal respiratory effort, R base dullness, and L decreased breath sounds.   Heart:  normal rate and regular rhythm.   Msk:  decreased ROM and joint tenderness.   Extremities:  patient has 1+ pitting edema lower legs bilaterally Neurologic:  alert & oriented X3, cranial nerves II-XII intact, and strength normal in all extremities.     Impression & Recommendations:  Problem # 1:  MASS, LUNG (ICD-786.6) I have spent greater that 30 min face to face evaluating this patient long discussion owht daughter and pt as to what to do inf the mass is still present on the second fild with age, hx of cancer, and recent loss of wife he is tending toward "doing nothering" but we will repeat the cxr today and consider a CT for further definition is he so desires her is AF with no elevation f WBC's so infectons process is less likely unless it represents a walled off infecton pt and daughter both participated in this discussion Orders: T-2 View CXR (71020TC)  Complete Medication List: 1)  Calcium 500/d 500-125 Mg-unit Tabs (Calcium carbonate-vitamin d) .... Once daily 2)  Nabumetone 750 Mg Tabs (  Nabumetone) .... Two times a day 3)  Bayer Low Strength 81 Mg Tbec (Aspirin) .... Once daily 4)  Mirtazapine 15 Mg Tabs (Mirtazapine) .... Take 1 tablet by mouth at bedtime 5)  Folbee 2.5-25-1 Mg Tabs (Folic acid-vit b6-vit b12) .... One by mouth daily 6)  Gabapentin 100 Mg Caps (Gabapentin) .... One by mouth three times a day 7)  Cyanocobalamin 1000 Mcg/ml Soln (Cyanocobalamin) .Marland Kitchen.. 1ml monthly 8)  Bd Eclipse Syringe 25g X 5/8" 3 Ml Misc (Syringe/needle (disp)) .... Use monthly with b12 injections 9)  Levaquin 500 Mg Tabs (Levofloxacin) .... One tab by mouth daily for 10 days  Hypertension Assessment/Plan:      The patient's hypertensive risk group is category B: At least one risk factor (excluding diabetes) with no target organ  damage.  Today's blood pressure is 146/74.  His blood pressure goal is < 140/90.  Patient Instructions: 1)  Please schedule a follow-up appointment in 2 months. Prescriptions: FOLBEE 2.5-25-1 MG TABS (FOLIC ACID-VIT B6-VIT B12) one by mouth daily  #30 x 11   Entered by:   Willy Eddy, LPN   Authorized by:   Stacie Glaze MD   Signed by:   Willy Eddy, LPN on 81/19/1478   Method used:   [facility]        [facility]  [address] [phone][facility] (retail[facility]       [address]       [place], [place]  [address]       Ph: [phone]       Fax: [phone]   RxID:   [id number] MIRTAZAPINE 15 MG TABS (MIRTAZAPINE) Take 1 tablet by mouth at bedtime  #30 x 5   Entered by:   Willy Eddy, LPN   Authorized by:   Stacie Glaze MD   Signed by:   Willy Eddy, LPN on 25/95/6387   Method used:   [facility]        [facility]  [address] [phone]* (retail)       [address]       [place], [place]  [address]       Ph: [phone]       Fax: [phone]   RxID:   [id number] NABUMETONE 750 MG  TABS (NABUMETONE) two times a day  #60 Tablet x 5   Entered by:   Willy Eddy, LPN   Authorized by:   Stacie Glaze MD   Signed by:   Willy Eddy, LPN on 62/83/1517   Method used:   [facility]        [facility]  [address] [phone]* (retail)       [address]       [place], [place]  [address]       Ph: [phone]       Fax: [phone]   RxID:   [id number]    Orders Added: 1)  Est. Patient Level IV [01751] 2)  T-2 View CXR [71020TC]

## 2010-06-02 NOTE — Assessment & Plan Note (Signed)
Summary: follow up on fluid retention/cjr   Vital Signs:  Patient profile:   77 year old male Weight:      199 pounds Temp:     98.5 degrees F oral BP sitting:   150 / 72  (left arm) Cuff size:   regular  Vitals Entered By: Sid Falcon LPN (Sep 28, 2009 11:23 AM) CC: Follow-up following Dr Truett Perna OV 5/26   History of Present Illness: Patient seen with increased fluid retention possibly over several weeks. This noted by oncologist  last week. No lab work with the exception of B12 level.  Patient and daughter have noticed an increase in edema of the lower legs recently. Possibly mild increased dyspnea with activity but not at rest. No orthopnea or PND symptoms. No history of CHF. Past medical history significant for non-Hodgkin's lymphoma, peripheral autonomic neuropathy, chronic insomnia, osteoarthritis involving multiple joints, hypertension, and history of prostate cancer.  Patient takes nabumetone and gabapentin but no recent change in dosage. Denies any recent chest pains. No recent dietary change.  Allergies (verified): No Known Drug Allergies  Past History:  Past Medical History: Last updated: 06/08/2008 Hypertension Prostate cancer, hx of COPD Osteoarthritis constipation NHL---follwed by dr Truett Perna  Past Surgical History: Last updated: 2006-12-08 Hemorrhoidectomy  Family History: Last updated: 12-08-06 father died from cancer mother died from "age" at 47  Social History: Last updated: 09/28/2009 Former Smoker Alcohol use-no Drug use-no Wife deceased 09-11-09 family checks on them frequently  Risk Factors: Smoking Status: quit (11/26/2006) PMH-FH-SH reviewed for relevance  Social History: Former Smoker Alcohol use-no Drug use-no Wife deceased 2009/09/11 family checks on them frequently  Review of Systems       The patient complains of dyspnea on exertion and peripheral edema.  The patient denies anorexia, fever, weight loss, chest pain, syncope,  prolonged cough, headaches, abdominal pain, incontinence, muscle weakness, depression, and enlarged lymph nodes.    Physical Exam  General:  Well-developed,well-nourished,in no acute distress; alert,appropriate and cooperative throughout examination Mouth:  Oral mucosa and oropharynx without lesions or exudates.  Teeth in good repair. Neck:  No deformities, masses, or tenderness noted. Lungs:  patient has some faint rales both bases left greater than right.  No wheezes. Heart:  slightly bradycardic with heart rate 56 and regularno gallop.   Abdomen:  soft and non-tender.   Extremities:  patient has 1+ pitting edema lower legs bilaterally Neurologic:  alert & oriented X3, cranial nerves II-XII intact, and strength normal in all extremities.     Impression & Recommendations:  Problem # 1:  EDEMA LEG (ICD-782.3) ?multifacorial .  Screening labs as below and check CXR-r/o CHF changes. Orders: T-2 View CXR (71020TC) Venipuncture (04540) TLB-BMP (Basic Metabolic Panel-BMET) (80048-METABOL) TLB-TSH (Thyroid Stimulating Hormone) (84443-TSH) TLB-Hepatic/Liver Function Pnl (80076-HEPATIC) TLB-CBC Platelet - w/Differential (85025-CBCD) TLB-BNP (B-Natriuretic Peptide) (83880-BNPR)  Problem # 2:  DYSPNEA ON EXERTION (ICD-786.09) rule out CHF.  No fever or signif cough to suggest pneumonia.  Complete Medication List: 1)  Calcium 500/d 500-125 Mg-unit Tabs (Calcium carbonate-vitamin d) .... Once daily 2)  Nabumetone 750 Mg Tabs (Nabumetone) .... Two times a day 3)  Bayer Low Strength 81 Mg Tbec (Aspirin) .... Once daily 4)  Mirtazapine 15 Mg Tabs (Mirtazapine) .... Take 1 tablet by mouth at bedtime 5)  Folbee 2.5-25-1 Mg Tabs (Folic acid-vit b6-vit b12) .... One by mouth daily 6)  Gabapentin 100 Mg Caps (Gabapentin) .... One by mouth three times a day 7)  Cyanocobalamin 1000 Mcg/ml Soln (Cyanocobalamin) .Marland Kitchen.. 1ml  monthly 8)  Bd Eclipse Syringe 25g X 5/8" 3 Ml Misc (Syringe/needle (disp)) ....  Use monthly with b12 injections  Patient Instructions: 1)  Limit your Sodium(salt) .  2)  Please schedule a follow-up appointment in 2 weeks with Dr Lovell Sheehan.

## 2010-06-02 NOTE — Assessment & Plan Note (Signed)
Summary: cough/congestion/runny nose/cjr   Vital Signs:  Patient profile:   77 year old male Temp:     97.8 degrees F oral BP sitting:   160 / 72  Vitals Entered By: Sid Falcon LPN (Aug 31, 452 12:15 PM) CC: Congestion, runny nose X 2 weeks   History of Present Illness: 77 year old seen as a work in with two-week history of some rhinorrhea with clear mucus. Occasional nonproductive cough. No dyspnea above baseline. No fever reported. Denies any body aches. Occasional clear discharge from both eyes with some pruritus. Taken Mucinex on a couple of occasions. Ex- smoker. No hemoptysis.  Allergies (verified): No Known Drug Allergies  Past History:  Past Medical History: Last updated: 06/08/2008 Hypertension Prostate cancer, hx of COPD Osteoarthritis constipation NHL---follwed by dr Truett Perna  Review of Systems  The patient denies anorexia, fever, weight loss, hoarseness, prolonged cough, headaches, and hemoptysis.    Physical Exam  General:  patient alert and cooperative and in no distress Ears:  External ear exam shows no significant lesions or deformities.  Otoscopic examination reveals clear canals, tympanic membranes are intact bilaterally without bulging, retraction, inflammation or discharge. Hearing is grossly normal bilaterally. Mouth:  Oral mucosa and oropharynx without lesions or exudates.  Teeth in good repair. Neck:  No deformities, masses, or tenderness noted. Lungs:  patient has slightly decreased aeration left base compared to right and daughter states this has been noted by oncologist previously. Also had some faint crackles initially with breathing (L base) but these cleared after several deep breaths Heart:  normal rate and regular rhythm.   Extremities:  no signif pitting edema.   Impression & Recommendations:  Problem # 1:  ALLERGIC RHINITIS (ICD-477.9) recommend trial of PLAIN Allegra or Zyrtec. Followup promptly for any fever or  dyspnea.  Complete Medication List: 1)  Calcium 500/d 500-125 Mg-unit Tabs (Calcium carbonate-vitamin d) .... Once daily 2)  Nabumetone 750 Mg Tabs (Nabumetone) .... Two times a day 3)  Bayer Low Strength 81 Mg Tbec (Aspirin) .... Once daily 4)  Mirtazapine 15 Mg Tabs (Mirtazapine) .... Take 1 tablet by mouth at bedtime 5)  Folbee 2.5-25-1 Mg Tabs (Folic acid-vit b6-vit b12) .... One by mouth daily 6)  Gabapentin 100 Mg Caps (Gabapentin) .... One by mouth three times a day 7)  Cyanocobalamin 1000 Mcg/ml Soln (Cyanocobalamin) .Marland Kitchen.. 1ml monthly 8)  Bd Eclipse Syringe 25g X 5/8" 3 Ml Misc (Syringe/needle (disp)) .... Use monthly with b12 injections  Patient Instructions: 1)  try over-the-counter Allegra or Zyrtec 2)  Avoid decongestants 3)  Follow up promptly for any fever or shortness of breath.

## 2010-06-02 NOTE — Letter (Signed)
Summary: [facility]  [facility]   Imported By: Maryln Gottron 03/01/2010 14:42:15  _____________________________________________________________________  External Attachment:    Type:   Image     Comment:   External Document

## 2010-06-02 NOTE — Letter (Signed)
Summary: [facility]  [facility]   Imported By: Maryln Gottron 05/05/2009 12:42:35  _____________________________________________________________________  External Attachment:    Type:   Image     Comment:   External Document

## 2010-06-03 ENCOUNTER — Encounter: Payer: Self-pay | Admitting: Internal Medicine

## 2010-06-03 ENCOUNTER — Ambulatory Visit (INDEPENDENT_AMBULATORY_CARE_PROVIDER_SITE_OTHER): Payer: Medicare Other | Admitting: Internal Medicine

## 2010-06-03 DIAGNOSIS — I10 Essential (primary) hypertension: Secondary | ICD-10-CM

## 2010-06-03 DIAGNOSIS — J449 Chronic obstructive pulmonary disease, unspecified: Secondary | ICD-10-CM

## 2010-06-03 DIAGNOSIS — G909 Disorder of the autonomic nervous system, unspecified: Secondary | ICD-10-CM

## 2010-06-03 DIAGNOSIS — M1710 Unilateral primary osteoarthritis, unspecified knee: Secondary | ICD-10-CM

## 2010-06-03 MED ORDER — ETODOLAC 200 MG PO CAPS: 200.0000 mg | ORAL_CAPSULE | Freq: Three times a day (TID) | ORAL | Status: DC

## 2010-06-03 MED ORDER — GABAPENTIN 100 MG PO TABS: 300.0000 mg | ORAL_TABLET | Freq: Three times a day (TID) | ORAL | Status: DC

## 2010-06-03 NOTE — Progress Notes (Signed)
  Subjective:    Patient ID: Antonio Bradley, male    DOB: Dec 24, 1912, 77 y.o.   MRN: [id number]  HPI   Patient is a 77 year old white male who presents for followup of chronic B12 anemia peripheral neuropathy hypertension A. Chronic obstructive lung disease.   he is also monitored for chronic constipation osteoarthritis of the lower extremities primarily his knees.  He also notes pain in the right wrist which is the risk that he uses his cane with there is moderate swelling apparent.  He is on Relafen but this medication has been moved to tear 3 of his medication list and is requesting a medication it may be more cost effective his breathing and blood pressure appear to be well controlled he is in no apparent respiratory distress  Review of Systems  he is an elderly-appearing white male in no apparent distress he has no complaints of shortness of breath chest pain nausea vomiting GI or GU symptoms. He is alert and oriented without problems  Of communication and cognition is normal for age.  He has no reported cough infectious symptoms diarrhea hematochezia or melena.      Objective:   Physical Exam   On physical examination he is a pleasant well-developed elderly white male in no apparent distress HEENT reveals pupils are equal round reactive to light and accommodation his neck is supple his heart examination reveals a regular rate and rhythm his lung examination reveals good air movement with dry crackles bilaterally his abdomen appeared to be soft nontender his extremity examination reveals slight edema he is oriented to person place and time. He appears appropriate and is interactive.        Assessment & Plan:   1. osteoarthritis both in the hands and the knees the patient has been on Relafen we will review his formulary and try to find a less costly alternative we'll give consideration for using etodolac.  2. COPD he is breathing well stable on his current regimen no intervention  planned.  3. B12 deficiency the patient has B12 levels checked at his oncologist and they're reported to be normal  4. Hypertension. His blood pressure was 144/80 in normal sinus rhythm and  He is on no medication at this time. 5. Hand pain we'll give the patient topical samples of pensaid

## 2010-06-03 NOTE — Patient Instructions (Addendum)
Placed 10 drops of the pensaid on your hand up to 4 times a day for pain.  We have also etodolac 200 mg 3 times a day for pain  and we have increased the Neurontin to 300 mg 3 times a day

## 2010-06-13 ENCOUNTER — Other Ambulatory Visit: Payer: Self-pay | Admitting: Internal Medicine

## 2010-06-14 ENCOUNTER — Other Ambulatory Visit: Payer: Self-pay | Admitting: Internal Medicine

## 2010-06-14 NOTE — Telephone Encounter (Signed)
As we discussed 300mg  tid which is 3 of the 100 tid

## 2010-07-14 ENCOUNTER — Other Ambulatory Visit: Payer: Self-pay | Admitting: *Deleted

## 2010-07-14 MED ORDER — TEMAZEPAM 30 MG PO CAPS: 30.0000 mg | ORAL_CAPSULE | Freq: Every evening | ORAL | Status: DC | PRN

## 2010-07-25 ENCOUNTER — Other Ambulatory Visit: Payer: Self-pay | Admitting: Oncology

## 2010-07-25 ENCOUNTER — Encounter (HOSPITAL_BASED_OUTPATIENT_CLINIC_OR_DEPARTMENT_OTHER): Payer: Medicare Other | Admitting: Oncology

## 2010-07-25 ENCOUNTER — Ambulatory Visit (HOSPITAL_COMMUNITY)
Admission: RE | Admit: 2010-07-25 | Discharge: 2010-07-25 | Disposition: A | Discharge status: Home / Self Care | Payer: Medicare Other | Source: Ambulatory Visit | Attending: Oncology | Admitting: Oncology

## 2010-07-25 DIAGNOSIS — R9389 Abnormal findings on diagnostic imaging of other specified body structures: Secondary | ICD-10-CM

## 2010-07-25 DIAGNOSIS — C61 Malignant neoplasm of prostate: Secondary | ICD-10-CM

## 2010-07-25 DIAGNOSIS — R109 Unspecified abdominal pain: Secondary | ICD-10-CM

## 2010-07-25 DIAGNOSIS — R918 Other nonspecific abnormal finding of lung field: Secondary | ICD-10-CM | POA: Insufficient documentation

## 2010-07-25 DIAGNOSIS — C8583 Other specified types of non-Hodgkin lymphoma, intra-abdominal lymph nodes: Secondary | ICD-10-CM

## 2010-07-25 DIAGNOSIS — R209 Unspecified disturbances of skin sensation: Secondary | ICD-10-CM

## 2010-07-30 ENCOUNTER — Inpatient Hospital Stay (HOSPITAL_COMMUNITY)
Admission: EM | Admit: 2010-07-30 | Discharge: 2010-08-10 | DRG: 871 | Disposition: A | Discharge status: Skilled Nursing Facility | Payer: Medicare Other | Attending: Internal Medicine | Admitting: Internal Medicine

## 2010-07-30 ENCOUNTER — Inpatient Hospital Stay (HOSPITAL_COMMUNITY): Payer: Medicare Other

## 2010-07-30 ENCOUNTER — Emergency Department (HOSPITAL_COMMUNITY): Payer: Medicare Other

## 2010-07-30 DIAGNOSIS — Z87898 Personal history of other specified conditions: Secondary | ICD-10-CM

## 2010-07-30 DIAGNOSIS — Z7982 Long term (current) use of aspirin: Secondary | ICD-10-CM

## 2010-07-30 DIAGNOSIS — R791 Abnormal coagulation profile: Secondary | ICD-10-CM | POA: Diagnosis present

## 2010-07-30 DIAGNOSIS — N179 Acute kidney failure, unspecified: Secondary | ICD-10-CM | POA: Diagnosis present

## 2010-07-30 DIAGNOSIS — R5381 Other malaise: Secondary | ICD-10-CM | POA: Diagnosis present

## 2010-07-30 DIAGNOSIS — I498 Other specified cardiac arrhythmias: Secondary | ICD-10-CM | POA: Diagnosis present

## 2010-07-30 DIAGNOSIS — J189 Pneumonia, unspecified organism: Secondary | ICD-10-CM | POA: Diagnosis present

## 2010-07-30 DIAGNOSIS — I2489 Other forms of acute ischemic heart disease: Secondary | ICD-10-CM | POA: Diagnosis present

## 2010-07-30 DIAGNOSIS — I509 Heart failure, unspecified: Secondary | ICD-10-CM | POA: Diagnosis present

## 2010-07-30 DIAGNOSIS — E873 Alkalosis: Secondary | ICD-10-CM | POA: Diagnosis present

## 2010-07-30 DIAGNOSIS — E872 Acidosis, unspecified: Secondary | ICD-10-CM | POA: Diagnosis present

## 2010-07-30 DIAGNOSIS — I82409 Acute embolism and thrombosis of unspecified deep veins of unspecified lower extremity: Secondary | ICD-10-CM | POA: Diagnosis present

## 2010-07-30 DIAGNOSIS — J690 Pneumonitis due to inhalation of food and vomit: Secondary | ICD-10-CM | POA: Diagnosis present

## 2010-07-30 DIAGNOSIS — I248 Other forms of acute ischemic heart disease: Secondary | ICD-10-CM | POA: Diagnosis present

## 2010-07-30 DIAGNOSIS — D72829 Elevated white blood cell count, unspecified: Secondary | ICD-10-CM | POA: Diagnosis present

## 2010-07-30 DIAGNOSIS — Z8546 Personal history of malignant neoplasm of prostate: Secondary | ICD-10-CM

## 2010-07-30 DIAGNOSIS — A419 Sepsis, unspecified organism: Secondary | ICD-10-CM | POA: Diagnosis present

## 2010-07-30 HISTORY — DX: Malignant neoplasm of prostate: C61

## 2010-07-30 LAB — POCT I-STAT 3, ART BLOOD GAS (G3+)
Acid-base deficit: 4 mmol/L — ABNORMAL HIGH (ref 0.0–2.0)
Patient temperature: 98.6
TCO2: 20 mmol/L (ref 0–100)
pCO2 arterial: 30.4 mmHg — ABNORMAL LOW (ref 35.0–45.0)
pH, Arterial: 7.412 (ref 7.350–7.450)
pO2, Arterial: 63 mmHg — ABNORMAL LOW (ref 80.0–100.0)

## 2010-07-30 LAB — COMPREHENSIVE METABOLIC PANEL
AST: 41 U/L — ABNORMAL HIGH (ref 0–37)
Albumin: 3 g/dL — ABNORMAL LOW (ref 3.5–5.2)
CO2: 20 mEq/L (ref 19–32)
Calcium: 8.8 mg/dL (ref 8.4–10.5)
Creatinine, Ser: 1.75 mg/dL — ABNORMAL HIGH (ref 0.4–1.5)
GFR calc Af Amer: 44 mL/min — ABNORMAL LOW (ref >60)
Total Bilirubin: 0.7 mg/dL (ref 0.3–1.2)
Total Protein: 6.4 g/dL (ref 6.0–8.3)

## 2010-07-30 LAB — BASIC METABOLIC PANEL
BUN: 26 mg/dL — ABNORMAL HIGH (ref 6–23)
CO2: 24 mEq/L (ref 19–32)
Chloride: 103 mEq/L (ref 96–112)
Creatinine, Ser: 1.67 mg/dL — ABNORMAL HIGH (ref 0.4–1.5)
Glucose, Bld: 101 mg/dL — ABNORMAL HIGH (ref 70–99)
Potassium: 3.7 mEq/L (ref 3.5–5.1)

## 2010-07-30 LAB — MAGNESIUM: Magnesium: 1.9 mg/dL (ref 1.5–2.5)

## 2010-07-30 LAB — GLUCOSE, CAPILLARY: Glucose-Capillary: 129 mg/dL — ABNORMAL HIGH (ref 70–99)

## 2010-07-30 LAB — MRSA PCR SCREENING: MRSA by PCR: NEGATIVE

## 2010-07-30 LAB — DIFFERENTIAL
Basophils Relative: 0 % (ref 0–1)
Eosinophils Absolute: 0 10*3/uL (ref 0.0–0.7)
Lymphs Abs: 0.9 10*3/uL (ref 0.7–4.0)
Monocytes Absolute: 0.8 10*3/uL (ref 0.1–1.0)
Neutro Abs: 8.5 10*3/uL — ABNORMAL HIGH (ref 1.7–7.7)

## 2010-07-30 LAB — CK TOTAL AND CKMB (NOT AT ARMC)
Relative Index: 2.8 — ABNORMAL HIGH (ref 0.0–2.5)
Total CK: 136 U/L (ref 7–232)

## 2010-07-30 LAB — POCT CARDIAC MARKERS
CKMB, poc: 4.9 ng/mL (ref 1.0–8.0)
Myoglobin, poc: 363 ng/mL (ref 12–200)
Troponin i, poc: 0.19 ng/mL — ABNORMAL HIGH (ref 0.00–0.09)

## 2010-07-30 LAB — URINALYSIS, ROUTINE W REFLEX MICROSCOPIC
Glucose, UA: 100 mg/dL — AB
Ketones, ur: NEGATIVE mg/dL
Leukocytes, UA: NEGATIVE
Nitrite: NEGATIVE
Protein, ur: 100 mg/dL — AB
pH: 5.5 (ref 5.0–8.0)

## 2010-07-30 LAB — CARDIAC PANEL(CRET KIN+CKTOT+MB+TROPI): Relative Index: 2.9 — ABNORMAL HIGH (ref 0.0–2.5)

## 2010-07-30 LAB — TROPONIN I: Troponin I: 0.52 ng/mL (ref 0.00–0.06)

## 2010-07-30 LAB — BRAIN NATRIURETIC PEPTIDE
Pro B Natriuretic peptide (BNP): 124 pg/mL — ABNORMAL HIGH (ref 0.0–100.0)
Pro B Natriuretic peptide (BNP): 135 pg/mL — ABNORMAL HIGH (ref 0.0–100.0)

## 2010-07-30 LAB — CBC
MCH: 33.7 pg (ref 26.0–34.0)
MCHC: 35.8 g/dL (ref 30.0–36.0)
Platelets: 175 10*3/uL (ref 150–400)
RDW: 14.7 % (ref 11.5–15.5)

## 2010-07-30 LAB — LACTIC ACID, PLASMA: Lactic Acid, Venous: 4.4 mmol/L — ABNORMAL HIGH (ref 0.5–2.2)

## 2010-07-31 ENCOUNTER — Inpatient Hospital Stay (HOSPITAL_COMMUNITY): Payer: Medicare Other

## 2010-07-31 LAB — CARDIAC PANEL(CRET KIN+CKTOT+MB+TROPI)
CK, MB: 2.6 ng/mL (ref 0.3–4.0)
CK, MB: 1.8 ng/mL (ref 0.3–4.0)
Relative Index: INVALID (ref 0.0–2.5)
Total CK: 104 U/L (ref 7–232)
Total CK: 62 U/L (ref 7–232)
Troponin I: 0.37 ng/mL — ABNORMAL HIGH (ref 0.00–0.06)
Troponin I: 0.2 ng/mL — ABNORMAL HIGH (ref 0.00–0.06)

## 2010-07-31 LAB — BASIC METABOLIC PANEL
BUN: 26 mg/dL — ABNORMAL HIGH (ref 6–23)
CO2: 23 mEq/L (ref 19–32)
Calcium: 8.7 mg/dL (ref 8.4–10.5)
Glucose, Bld: 135 mg/dL — ABNORMAL HIGH (ref 70–99)
Sodium: 136 mEq/L (ref 135–145)

## 2010-07-31 LAB — URINE CULTURE: Culture: NO GROWTH

## 2010-07-31 LAB — CBC
HCT: 34.3 % — ABNORMAL LOW (ref 39.0–52.0)
MCHC: 33.8 g/dL (ref 30.0–36.0)
Platelets: 172 10*3/uL (ref 150–400)
RDW: 14.8 % (ref 11.5–15.5)
WBC: 8 10*3/uL (ref 4.0–10.5)

## 2010-07-31 LAB — HEMOGLOBIN A1C
Hgb A1c MFr Bld: 5.6 % (ref <5.7)
Mean Plasma Glucose: 114 mg/dL (ref <117)

## 2010-07-31 LAB — LEGIONELLA ANTIGEN, URINE: Legionella Antigen, Urine: NEGATIVE

## 2010-07-31 LAB — BRAIN NATRIURETIC PEPTIDE: Pro B Natriuretic peptide (BNP): 101 pg/mL — ABNORMAL HIGH (ref 0.0–100.0)

## 2010-08-01 ENCOUNTER — Inpatient Hospital Stay (HOSPITAL_COMMUNITY): Payer: Medicare Other

## 2010-08-01 LAB — CBC
Hemoglobin: 11.1 g/dL — ABNORMAL LOW (ref 13.0–17.0)
MCH: 31.4 pg (ref 26.0–34.0)
MCHC: 32.8 g/dL (ref 30.0–36.0)
RDW: 14.9 % (ref 11.5–15.5)

## 2010-08-01 LAB — LACTIC ACID, PLASMA: Lactic Acid, Venous: 1.4 mmol/L (ref 0.5–2.2)

## 2010-08-01 LAB — BASIC METABOLIC PANEL
BUN: 29 mg/dL — ABNORMAL HIGH (ref 6–23)
Calcium: 8.6 mg/dL (ref 8.4–10.5)
Chloride: 99 mEq/L (ref 96–112)
Creatinine, Ser: 1.76 mg/dL — ABNORMAL HIGH (ref 0.4–1.5)

## 2010-08-02 ENCOUNTER — Inpatient Hospital Stay (HOSPITAL_COMMUNITY): Payer: Medicare Other

## 2010-08-02 ENCOUNTER — Encounter (HOSPITAL_COMMUNITY): Payer: Self-pay | Admitting: Radiology

## 2010-08-02 LAB — BASIC METABOLIC PANEL
CO2: 25 mEq/L (ref 19–32)
Calcium: 8.6 mg/dL (ref 8.4–10.5)
Creatinine, Ser: 1.51 mg/dL — ABNORMAL HIGH (ref 0.4–1.5)
GFR calc Af Amer: 52 mL/min — ABNORMAL LOW (ref >60)
GFR calc non Af Amer: 43 mL/min — ABNORMAL LOW (ref >60)
Glucose, Bld: 115 mg/dL — ABNORMAL HIGH (ref 70–99)

## 2010-08-02 LAB — CBC
HCT: 33.3 % — ABNORMAL LOW (ref 39.0–52.0)
Hemoglobin: 11.1 g/dL — ABNORMAL LOW (ref 13.0–17.0)
MCV: 96.5 fL (ref 78.0–100.0)
RBC: 3.45 MIL/uL — ABNORMAL LOW (ref 4.22–5.81)
RDW: 14.7 % (ref 11.5–15.5)

## 2010-08-03 ENCOUNTER — Inpatient Hospital Stay (HOSPITAL_COMMUNITY): Payer: Medicare Other

## 2010-08-03 DIAGNOSIS — R0602 Shortness of breath: Secondary | ICD-10-CM

## 2010-08-03 LAB — BASIC METABOLIC PANEL
BUN: 29 mg/dL — ABNORMAL HIGH (ref 6–23)
CO2: 27 mEq/L (ref 19–32)
Calcium: 9 mg/dL (ref 8.4–10.5)
Chloride: 101 mEq/L (ref 96–112)
Creatinine, Ser: 1.74 mg/dL — ABNORMAL HIGH (ref 0.4–1.5)
Glucose, Bld: 117 mg/dL — ABNORMAL HIGH (ref 70–99)

## 2010-08-03 LAB — CBC
HCT: 34 % — ABNORMAL LOW (ref 39.0–52.0)
Hemoglobin: 11.4 g/dL — ABNORMAL LOW (ref 13.0–17.0)
MCH: 32.5 pg (ref 26.0–34.0)
MCHC: 33.5 g/dL (ref 30.0–36.0)
MCV: 96.9 fL (ref 78.0–100.0)
RBC: 3.51 MIL/uL — ABNORMAL LOW (ref 4.22–5.81)

## 2010-08-03 MED ORDER — XENON XE 133 GAS
10.0000 | GAS_FOR_INHALATION | Freq: Once | RESPIRATORY_TRACT | Status: AC | PRN
  Administered 2010-08-03: 11 via RESPIRATORY_TRACT

## 2010-08-03 MED ORDER — TECHNETIUM TO 99M ALBUMIN AGGREGATED
6.0000 | Freq: Once | INTRAVENOUS | Status: AC | PRN
  Administered 2010-08-03: 6.6 via INTRAVENOUS

## 2010-08-04 LAB — BASIC METABOLIC PANEL
BUN: 29 mg/dL — ABNORMAL HIGH (ref 6–23)
Calcium: 8.8 mg/dL (ref 8.4–10.5)
GFR calc non Af Amer: 40 mL/min — ABNORMAL LOW (ref >60)
Glucose, Bld: 113 mg/dL — ABNORMAL HIGH (ref 70–99)
Sodium: 133 mEq/L — ABNORMAL LOW (ref 135–145)

## 2010-08-04 LAB — PROTIME-INR: Prothrombin Time: 15.4 seconds — ABNORMAL HIGH (ref 11.6–15.2)

## 2010-08-04 LAB — CBC
HCT: 35.1 % — ABNORMAL LOW (ref 39.0–52.0)
Hemoglobin: 12 g/dL — ABNORMAL LOW (ref 13.0–17.0)
MCHC: 34.2 g/dL (ref 30.0–36.0)
MCV: 95.9 fL (ref 78.0–100.0)
RDW: 14.2 % (ref 11.5–15.5)

## 2010-08-05 LAB — CULTURE, BLOOD (ROUTINE X 2)
Culture  Setup Time: 201203311755
Culture: NO GROWTH

## 2010-08-05 LAB — PROTIME-INR
INR: 1.25 (ref 0.00–1.49)
Prothrombin Time: 15.9 seconds — ABNORMAL HIGH (ref 11.6–15.2)

## 2010-08-05 LAB — CBC
MCHC: 34.3 g/dL (ref 30.0–36.0)
Platelets: 271 10*3/uL (ref 150–400)
RDW: 14.2 % (ref 11.5–15.5)

## 2010-08-05 LAB — BASIC METABOLIC PANEL
BUN: 25 mg/dL — ABNORMAL HIGH (ref 6–23)
Creatinine, Ser: 1.66 mg/dL — ABNORMAL HIGH (ref 0.4–1.5)
GFR calc non Af Amer: 39 mL/min — ABNORMAL LOW (ref >60)

## 2010-08-05 LAB — GLUCOSE, CAPILLARY: Glucose-Capillary: 138 mg/dL — ABNORMAL HIGH (ref 70–99)

## 2010-08-06 LAB — CBC
MCHC: 33.7 g/dL (ref 30.0–36.0)
RDW: 13.9 % (ref 11.5–15.5)

## 2010-08-06 LAB — PROTIME-INR
INR: 1.46 (ref 0.00–1.49)
Prothrombin Time: 17.9 seconds — ABNORMAL HIGH (ref 11.6–15.2)

## 2010-08-06 LAB — BASIC METABOLIC PANEL
BUN: 23 mg/dL (ref 6–23)
Chloride: 101 mEq/L (ref 96–112)
Creatinine, Ser: 1.73 mg/dL — ABNORMAL HIGH (ref 0.4–1.5)
Glucose, Bld: 117 mg/dL — ABNORMAL HIGH (ref 70–99)

## 2010-08-07 LAB — CBC
MCHC: 33.4 g/dL (ref 30.0–36.0)
MCV: 95.1 fL (ref 78.0–100.0)
Platelets: 338 10*3/uL (ref 150–400)
RDW: 14.1 % (ref 11.5–15.5)
WBC: 14.4 10*3/uL — ABNORMAL HIGH (ref 4.0–10.5)

## 2010-08-07 LAB — BASIC METABOLIC PANEL
BUN: 26 mg/dL — ABNORMAL HIGH (ref 6–23)
CO2: 25 mEq/L (ref 19–32)
Calcium: 8.8 mg/dL (ref 8.4–10.5)
Creatinine, Ser: 1.98 mg/dL — ABNORMAL HIGH (ref 0.4–1.5)
GFR calc Af Amer: 38 mL/min — ABNORMAL LOW (ref >60)
Glucose, Bld: 120 mg/dL — ABNORMAL HIGH (ref 70–99)

## 2010-08-08 LAB — CBC
HCT: 33.3 % — ABNORMAL LOW (ref 39.0–52.0)
Hemoglobin: 11.2 g/dL — ABNORMAL LOW (ref 13.0–17.0)
MCV: 95.1 fL (ref 78.0–100.0)
RBC: 3.5 MIL/uL — ABNORMAL LOW (ref 4.22–5.81)
WBC: 13 10*3/uL — ABNORMAL HIGH (ref 4.0–10.5)

## 2010-08-08 LAB — URINALYSIS, ROUTINE W REFLEX MICROSCOPIC
Bilirubin Urine: NEGATIVE
Glucose, UA: NEGATIVE mg/dL
Ketones, ur: NEGATIVE mg/dL
Protein, ur: NEGATIVE mg/dL

## 2010-08-08 LAB — BASIC METABOLIC PANEL
CO2: 26 mEq/L (ref 19–32)
Chloride: 102 mEq/L (ref 96–112)
GFR calc Af Amer: 35 mL/min — ABNORMAL LOW (ref >60)
Sodium: 137 mEq/L (ref 135–145)

## 2010-08-08 LAB — URINE MICROSCOPIC-ADD ON

## 2010-08-08 LAB — PROTIME-INR: INR: 1.91 — ABNORMAL HIGH (ref 0.00–1.49)

## 2010-08-09 ENCOUNTER — Inpatient Hospital Stay (HOSPITAL_COMMUNITY): Payer: Medicare Other

## 2010-08-09 LAB — CBC
Hemoglobin: 10.9 g/dL — ABNORMAL LOW (ref 13.0–17.0)
MCH: 32.3 pg (ref 26.0–34.0)
MCV: 95.5 fL (ref 78.0–100.0)
Platelets: 331 10*3/uL (ref 150–400)
RBC: 3.37 MIL/uL — ABNORMAL LOW (ref 4.22–5.81)
WBC: 10.5 10*3/uL (ref 4.0–10.5)

## 2010-08-09 LAB — HEMOGLOBIN AND HEMATOCRIT, BLOOD: Hemoglobin: 11.5 g/dL — ABNORMAL LOW (ref 13.0–17.0)

## 2010-08-09 LAB — URINE CULTURE
Colony Count: NO GROWTH
Culture  Setup Time: 201204091320

## 2010-08-09 LAB — BASIC METABOLIC PANEL
CO2: 25 mEq/L (ref 19–32)
Calcium: 8.6 mg/dL (ref 8.4–10.5)
GFR calc Af Amer: 36 mL/min — ABNORMAL LOW (ref >60)
GFR calc non Af Amer: 30 mL/min — ABNORMAL LOW (ref >60)
Glucose, Bld: 109 mg/dL — ABNORMAL HIGH (ref 70–99)
Potassium: 4.4 mEq/L (ref 3.5–5.1)
Sodium: 138 mEq/L (ref 135–145)

## 2010-08-09 LAB — PROTIME-INR: Prothrombin Time: 22.8 seconds — ABNORMAL HIGH (ref 11.6–15.2)

## 2010-08-10 LAB — BASIC METABOLIC PANEL
BUN: 22 mg/dL (ref 6–23)
CO2: 21 mEq/L (ref 19–32)
Calcium: 8.5 mg/dL (ref 8.4–10.5)
Glucose, Bld: 100 mg/dL — ABNORMAL HIGH (ref 70–99)
Sodium: 140 mEq/L (ref 135–145)

## 2010-08-10 LAB — CBC
HCT: 32.3 % — ABNORMAL LOW (ref 39.0–52.0)
Hemoglobin: 10.7 g/dL — ABNORMAL LOW (ref 13.0–17.0)
MCHC: 33.1 g/dL (ref 30.0–36.0)
MCV: 96.4 fL (ref 78.0–100.0)

## 2010-08-10 LAB — PROTIME-INR: INR: 2.19 — ABNORMAL HIGH (ref 0.00–1.49)

## 2010-08-16 LAB — URINE CULTURE

## 2010-08-16 LAB — URINALYSIS, ROUTINE W REFLEX MICROSCOPIC
Glucose, UA: 250 mg/dL — AB
Hgb urine dipstick: NEGATIVE
Specific Gravity, Urine: 1.019 (ref 1.005–1.030)
Urobilinogen, UA: 0.2 mg/dL (ref 0.0–1.0)
pH: 8 (ref 5.0–8.0)

## 2010-08-16 LAB — CBC
HCT: 37.9 % — ABNORMAL LOW (ref 39.0–52.0)
Hemoglobin: 13.2 g/dL (ref 13.0–17.0)
MCHC: 34.9 g/dL (ref 30.0–36.0)
Platelets: 206 10*3/uL (ref 150–400)
RDW: 15.9 % — ABNORMAL HIGH (ref 11.5–15.5)

## 2010-08-16 LAB — COMPREHENSIVE METABOLIC PANEL
BUN: 15 mg/dL (ref 6–23)
Calcium: 9.2 mg/dL (ref 8.4–10.5)
Glucose, Bld: 196 mg/dL — ABNORMAL HIGH (ref 70–99)
Total Protein: 6.6 g/dL (ref 6.0–8.3)

## 2010-08-16 LAB — DIFFERENTIAL
Lymphocytes Relative: 4 % — ABNORMAL LOW (ref 12–46)
Lymphs Abs: 0.5 10*3/uL — ABNORMAL LOW (ref 0.7–4.0)
Monocytes Relative: 5 % (ref 3–12)
Neutro Abs: 12 10*3/uL — ABNORMAL HIGH (ref 1.7–7.7)
Neutrophils Relative %: 90 % — ABNORMAL HIGH (ref 43–77)

## 2010-08-16 LAB — URINE MICROSCOPIC-ADD ON

## 2010-08-18 NOTE — Discharge Summary (Signed)
NAME:  Antonio Bradley, Antonio Bradley                 ACCOUNT NO.:  [id number]  MEDICAL RECORD NO.:  [id number]           PATIENT TYPE:  LOCATION:                                 FACILITY:  PHYSICIAN:  Kela Millin, M.D.DATE OF BIRTH:  11/16/1912  DATE OF ADMISSION: DATE OF DISCHARGE:  08/10/2010                        DISCHARGE SUMMARY - REFERRING   DISCHARGE DIAGNOSES: 1. Pneumonia, aspiration versus community-acquired. 2. Abnormal/elevated troponins - per Cardiology secondary to     pneumonia, aspiration versus community-acquired/demand ischemia,     the patient to follow up outpatient with Dr. Herbie Baltimore at [facility] and vascular for possible nuclear stress test. 3. Renal insufficiency - acute versus chronic, baseline unknown, his     creatinine today prior to discharge is 1.76 ( 1.75 upon admission). 4. Right lower extremity deep vein thrombosis - with VQ scan showing     low to intermediate probability for pulmonary embolus. 5. Deconditioning - to skilled nursing for rehab. 6. History of non-Hodgkin lymphoma. 7. Neuropathy. 8. History of prostate cancer.  PROCEDURES AND STUDIES: 1. Chest x-ray on July 30, 2010 - worsened pulmonary aeration with     increased bibasilar atelectasis. 2. Followup chest x-ray on July 30, 2010 - slight worsening lung     aeration suggesting worsening CHF. 3. Followup chest x-ray on July 31, 2010 - improve CHF pattern. 4. Chest x-ray on August 01, 2010, - low volumes and bibasilar     atelectasis persists.  Vascular congestion. 5. Chest x-ray on August 02, 2010, - no significant change in mild     basilar atelectasis and cardiomegaly. 6. CT scan of chest without contrast on August 02, 2010, - atelectasis     or infiltration in both lung bases with patchy airspace disease on     the left.  Mild pleural thickening without significant effusion.     Presumed reactive lymph nodes in the mediastinum. 7. Chest x-ray on August 03, 2010, - no change in  bibasilar air space     disease and small left pleural effusions versus thickening.  Mild     cardiomegaly. 8. VQ scan on August 03, 2010 - large matched perfusion defects within     the left lower lobe correlates with atelectasis versus infiltrates     in the left lower lobe on comparison CT.  While technically this     course as intermediate probability for PE, see for a low     probability for pulmonary embolism. 9. Followup chest x-ray on August 09, 2010 - low lung volumes with     probable bibasilar atelectasis. 10.A 2-D echocardiogram on July 31, 2010 - ejection fraction 70-80%.     Systolic function vigorous, there was mild concentric hypertrophy     wall motion normal.  Grade 1 diastolic dysfunction.  CONSULTATIONS:  Cardiology - Dr. Herbie Baltimore with [facility] and Vascular.  BRIEF HISTORY:  The patient is a pleasant 77 year old white male with the above-listed medical problems who presented with complaints of shortness of breath and chest pain for 3 days.  He reported that he had been feeling weak,  had a cough that was nonproductive and that he sounded congested.  The patient also reported fevers to 101.3 at home. In the ED, the patient had a temperature of 101.1 and a chest x-ray was done and the results are as stated above with worsening pulmonary aeration and to increase bibasilar atelectasis.  An ABG revealed a pH of 7.41 with a CO2 of 30.4 and an pO2 of 63, bicarb 19.8.  The patient was initially placed on BiPAP and admitted for further evaluation and management.  HOSPITAL COURSE: 1. Biasilar pneumonia - aspiration versus community-acquired.  Upon     admission, the patient was empirically started on IV antibiotics     with Zosyn and Zithromax, and cardiac enzymes were cycled and he     was also diuresed with Lasix.  A brain-natriuretic peptide was done     and it came back at 135, followup brain-natriuretic peptide was     101.  His cardiac enzymes were cycled and his  troponins were     elevated - 0.45, 0.37 and 0.20.  The patient was placed on     nitroglycerin drip and again as stated above was diuresed.  A 2-D     echocardiogram was done and the results as stated above.     Cardiology was consulted and Dr. Herbie Baltimore saw the patient, and     initially, his impression was that the patient had a component of     CHF and agreed with the above treatment and a beta-blocker was     added as well and the patient was started on IV heparin.  A 2-D     echocardiogram was done and as above his ejection fraction came     back at 70-80% with no wall motion abnormalities and Cardiology     followed up and the impression was that the mild troponin elevation     was secondary to pneumonia.  He recommended for the patient to     follow up with [facility] upon discharge     for an outpatient nuclear medicine stress test if he was a     candidate.  The patient was diuresed, but his creatinine was     increasing, reaching a peak of 2.11 in the hospital and a recheck     brain-natriuretic peptide came back at less than 30 and so the     Lasix was discontinued.  He continued to gradually improve with     treatment of his pneumonia and has been oxygenating well on room     air - 96%.  His cough is decreased.  He has remained afebrile and     his leukocytosis has resolved - 8.6 today prior to discharge.  The     impression was that pneumonia was the cause of his symptom on     admission.  He is clinically improved and has completed 12 days of     IV antibiotics during this hospital stay and will not require any     further antibiotics upon discharge. 2. Abnormal troponins - as discussed above.  The patient to follow up     with [facility] and Vascular for possible nuclear stress     test - with Dr. Herbie Baltimore. 3. Right lower extremity deep vein thrombosis - the pain with low to     intermediate, probability VQ scan for pulmonary embolus.  The      patient presented with shortness of  breath and as part of his     workup his CT scan of his chest was done but without contrast due     to his renal insufficiency, and so it was inadequate for evaluation     of PE.  Following that, he had Dopplers of his lower extremity done     which came back positive for DVT in his popliteal vein, a VQ scan     of his chest was done then and showed intermediate probability, but     per radiologist, it was a low probability.  The patient was started     on Lovenox along with Coumadin and his INR was therapeutic on August 09, 2010, and on recheck today it remains therapeutic at 2.19.  He     will require anticoagulation for the next 3-6 months, outpatient MD     to follow and evaluate for the discontinuation of anticoagulation     when appropriate.  After his INR became therapeutic on August 09, 2010, he had 2 episodes of bloody sputum with coughing, this was     monitored and his sputum has cleared up at this time and his     hemoglobin has remained stable - at 10.7 with a hematocrit of 32.3     at this time.  He is to continue his anticoagulation upon     discharge. 4. Renal insufficiency - acute versus chronic.  His baseline was     unknown and on admission his creatinine was 1.75.  He was diuresed     as discussed above and his creatinine reached a peak of 2.11, and     after discontinuing the Lasix and gently hydrating, his creatinine     has improved back to 1.76 today prior to discharge with a BUN of     22. 5. Deconditioning - PT, OT evaluated the patient and recommended     rehab.  He has been placed at the skilled nursing facility at this     time and will be discharged for further PT, OT. 6. History of non-Hodgkin lymphoma - in remission, follow up with     outpatient MD. 7. History of peripheral neuropathy - the patient is to continue his     gabapentin upon discharge  DISCHARGE MEDICATIONS: 1. Xopenex 0.63 mg q.4  h. p.r.n. 2.  Multivitamins one p.o. daily. 3. Metoprolol 12.5 mg p.o. b.i.d. 4. Coumadin 6 mg p.o. q.p.m. or as directed per MD, PT/INR in a.m. of     August 11, 2010, for further dosing. 5. Allegra 180 mg p.o. daily p.r.n. 6. Enteric-coated aspirin 81 mg p.o. daily. 7. Folbee 1 tablet p.o. daily. 8. Gabapentin 100 mg p.o. t.i.d. 9. Mucinex 600 mg 1 p.o. b.i.d. 10.Temazepam 30 mg p.o. nightly p.r.n. 11.Tylenol 500 mg 2 tablets q.6  h. p.r.n.  DISCHARGE CONDITION:  Improved/stable.  FOLLOWUP CARE: 1. Nursing home physician in 1-2 days. 2. Dr. Herbie Baltimore in 2-3 weeks, call [facility] and Vascular for     an appointment. 3. Dr. Truett Perna, Oncology as scheduled.     Kela Millin, M.D.     ACV/MEDQ  D:  08/10/2010  T:  08/10/2010  Job:  [id number]  cc:   Landry Corporal, MD G. Rolm Baptise, M.D.  Electronically Signed by Donnalee Curry M.D. on 08/18/2010 10:43:29 AM

## 2010-08-25 NOTE — H&P (Signed)
NAME:  Antonio Bradley, PERINE NO.:  [id number]  MEDICAL RECORD NO.:  [id number]           PATIENT TYPE:  E  LOCATION:  [facility]                         FACILITY:  [facility]  PHYSICIAN:  Samar Dass I Jabes Primo, MD      DATE OF BIRTH:  [birth year]  DATE OF ADMISSION:  07/30/2010 DATE OF DISCHARGE:                             HISTORY & PHYSICAL   CHIEF COMPLAINT:  Shortness of breath and chest pain for 3 day.  HISTORY OF PRESENT ILLNESS:  This is a 77 year old Caucasian male with a history of non-Hodgkin lymphoma, history of peripheral neuropathy and prostate cancer.  He has non-Hodgkin's lymphoma on remission for the last 3 years.  He follow up with Dr. Candis Musa.  According to the daughter, he did not receive chemo for more than 3 years.  The patient for the last 3 days feeling weak.  Was coughing, mainly dry, but lung seem congested.  Yesterday, the patient was complaining of cough and congestion and being managed by Tylenol by the daughter at home.  The patent noticed to have fever 101.3  at home.  His symptom progressively get worse.  When seen in the emergency room, the patient had a temperature of 101.1 with blood pressure 123/59 and the patient was having tachycardia with a heart rate of 120.  A chest x-ray was done, which suggesting worsening pulmonary aeration with increased bibasilar atelectasis.  The patient also had an ABG which suggest pH of 7.41, CO2 of 30.4, and oxygen 63 with bicarb of 19.8.  The patient accordingly placed on BiPAP and we were asked for consult for possible admission. The patient currently have shortness of breath and his chest seem congested.  The patient denies any chest pain, denies any nausea, vomiting, or abdominal pain.  Last bowel movement was today, as per daughter there is no evidence of hematemesis or hemoptysis and the stool was brown.  The patient admitted sometimes he have nausea.  He felt really weak and malaise.  He have chronic back pain.   Condition associated with some chills.  PAST MEDICAL HISTORY: 1. Significant for non-Hodgkin's lymphoma. 2. Neuropathy. 3. Prostate cancer.  DRUG ALLERGIES:  No known drug allergy.  PAST SURGICAL HISTORY:  The patient has history of hemorrhoidectomy and foot surgery.  MEDICATIONS: 1. Aspirin 81 mg p.o. daily. 2. Cyanocobalamin. 3. Folic acid. 4. Neurontin. 5. Restoril.  FAMILY HISTORY:  The patient admitted that his father died with the history of cancer to the bone.  Mother died with complication of pneumonia.  SOCIAL HISTORY:  The patient denies any smoking.  Denies any drinking. He is a widow.  He lives alone.  He has 3 grown daughters.  PHYSICAL EXAMINATION:  VITAL SIGNS:  Temperature 101.1, blood pressure 123/51, pulse rate 120, respiratory rate 24, and pulse ox 93% on BiPAP. GENERAL:  The patient is awake and alert on mild to moderate respiratory distress. HEENT:  Pupils are equal and reactive to light and accommodation.  No evidence of peripheral or central cyanosis.  Oral cavity did not fully examined secondary to the BiPAP.  Ear, there is no discharge.  NECK:  Supple.  No lymphadenopathy. HEART:  S1 and S2.  Tachy. LUNGS:  Bilateral crackles, more obvious on the right side. ABDOMEN:  Distended, nontender.  Bowel sounds positive.  No organomegaly.  No rebound, no guarding. EXTREMITIES:  Venous stasis changes with mild lower limb edema.  The patient has some mild ecchymosis on his right knee, but able to move all his extremities.  LABORATORY DATA:  Blood work of ABGs; pH 7.41, CO2 was 30.4, oxygen 63, bicarb 19.3.  Cardiac markers; troponin 0.19, sodium 131, potassium 3.9, chloride 102, CO2 of 20, glucose 142, BUN 28, creatinine 1.75, and BNP 124.  Urinalysis, there is moderate blood and bilirubin.  CBC; white blood cell 10.2, hemoglobin 12.6, hematocrit 35.2, and platelets 175. Chest x-ray suggest there is avascular congestion and cardiomegaly.  No overt  pulmonary edema, confluent air opacity or significant pleural effusion.  The patient on July 25, 2010, had an x-ray done which did show left middle lung zone density, once again noted breathing relatively fair from the prior exam.  ASSESSMENT/PLAN: 1. This is a 77 year old gentleman who presented with fever, shortness     of breath, found to have acute hypoxic and with respiratory     alkalosis which seem compensated with a pH of 7.412 most likely     secondary to pneumonia, cannot exclude community-acquired pneumonia     versus aspiration pneumonia.  The patient's BNP is 124.  The     patient will be admitted to ICU bed, family desire the patient to     be full code.  We will start the patient on Zosyn, vancomycin, and     Zithromax to cover for atypical pneumonia.  We will get complete     septic workup. 2. The patient have tachycardia, tachypnea and both of his lactic acid     which most likely secondary to sepsis.  The patient will be     monitored closely at ICU bed.  We will continue with BiPAP as     p.r.n.  we may need critical care consultation if condition     deteriorate.  We will get ABG.  We will change BiPAP to p.r.n. 3. Noted elevated troponin 2.19, EKG did show sinus tachycardia or     right bundle branch block and some left bundle fascicular block.     We will continue with cycling cardiac enzyme and get 2-D echo.  I     this time, the patient denies any chest pain.  I will repeat the     patient's EKG.  We will continue with aspirin.  I will add beta-     blockers to the patient's medication as the patient is having some     tachycardia at that time being. 4. Acute renal failure.  The patient's baseline unknown to [place].  His     BUN is 28 and creatinine is  1.75.  We will be giving some dental     hydration and keep the patient n.p.o. until evaluated by Speech     Therapist.  Further recommendation as hospital course progress.     Charlynn Salih Bosie Helper, MD    HIE/MEDQ   D:  07/30/2010  T:  07/30/2010  Job:  [id number]  cc:   Stacie Glaze, MD  Electronically Signed by Ebony Cargo MD on 08/25/2010 11:30:54 AM

## 2010-09-01 NOTE — Consult Note (Signed)
NAME:  SEDALE, JENIFER NO.:  [id number]  MEDICAL RECORD NO.:  [id number]           PATIENT TYPE:  I  LOCATION:  2908                         FACILITY:  [facility]  PHYSICIAN:  Landry Corporal, MD DATE OF BIRTH:  1913-02-07  DATE OF CONSULTATION:  07/31/2010 DATE OF DISCHARGE:                                CONSULTATION   REQUESTING PHYSICIAN:  [facility].  REASON FOR CONSULTATION:  CHF and elevated cardiac markers.  HISTORY OF PRESENT ILLNESS:  Mr. Stenseth is an extremely pleasant 77 year old white male who was admitted yesterday with pneumonia and congestive heart failure.  He was found to have mildly elevated cardiac enzymes on admission which are now trending down.  He has no previous cardiac history but does have history of non-Hodgkin's lymphoma, peripheral neuropathy, and a history of prostate cancer.  He states that he has not felt well since last week.  He has been experiencing cough which attimes has been dry and he has become very short of breath with any type of activity.  He developed a fever yesterday up to 101 and yesterday when his symptoms did not improve, therefore he was brought to the emergency department by his daughter for further evaluation and treatment.  He denies any chest pain, pressure, or heaviness.  He does report shortness of breath.  He denies any chest pain or palpitations or noticeable tachycardia, although that he was quite tachy when he arrived in the emergency department.  He complains of weakness as well.  Chest x- ray revealed increased basilar atelectasis and his ABG revealed a pH of 7.41 and CO2 of 30.4.  O2 was 63.  At that time, he was started on BiPAP.  He has been started on IV nitroglycerin and given IV Lasix as well and he has had 1500 mL of urine output.  He continues to complain of shortness of breath as well as cough, although he does not appear to be able to produce any sputum.  He denies any  hemoptysis.  PAST MEDICAL HISTORY: 1. Non-Hodgkin lymphoma. 2. Neuropathy. 3. Prostate cancer, his lymphoma is in remission.  DRUG ALLERGIES:  None known.  CURRENT MEDICATIONS: 1. Vancomycin. 2. Zithromax. 3. Zosyn. 4. Lopressor 12.5 mg p.o. b.i.d. 5. Xopenex 0.63 mg q.6 hours. 6. Aspirin 81 mg daily. 7. IV nitroglycerin at 5 mcg. 8. Lasix 40 mg IV.  FAMILY HISTORY:  Noncontributory.  SOCIAL HISTORY:  He is a widower.  He lives alone.  Prior to becoming ill, carried out his normal activities of daily living without any assistance.  He does not drive anymore.  His children have asked him to stop driving.  He denies tobacco or alcohol use.  REVIEW OF SYSTEMS:  As per HPI, otherwise negative.  PHYSICAL EXAMINATION:  VITAL SIGNS:  Blood pressure is 139/63, pulse is 88 and regular, O2 sat is 94% on 3 liters, his respiratory rate is 18, and his temp is 99.5. GENERAL:  This is a very pleasant 77 year old white male, in no acute distress. HEENT:  Pupils are equal and reactive to light and accommodation. Extraocular movements intact. NECK:  Supple.  No lymphadenopathy.  No notable thyromegaly or JVD. CARDIOVASCULAR:  S1 and S2 without appreciable murmur, gallop, or rubs. LUNGS:  Anteriorly revealed rhonchi bilaterally. ABDOMEN:  Soft, nontender without hepatosplenomegaly or masses. EXTREMITIES:  Radial, femoral, dorsal, pedal arteries are present. There is just a trace of lower extremity edema.  No clubbing, cyanosis or ulcers. NEUROLOGIC:  Oriented to person, place, and time.  Norma mood and affect.  Cranial nerves are grossly intact.  LABORATORY DATA:  Most recent BNP is 135.  CK is 133.  CK-MB is 3.9. Troponin is 0.45.  BMET; sodium is 137, potassium 3.7, chloride 103, CO2 is 24.  BUN is 26, creatinine 1.67, and glucose is 101.  Magnesium was 1.9.  Lactic acid was 4.4.  CBC revealed white blood cell count of 10.2, hemoglobin of 12.6, hematocrit of 35.2 with platelets of  175.  EKG reveals sinus rhythm with a right bundle-branch block.  IMPRESSION: 1. Elevated troponin, possible non-ST segment elevation myocardial     infarction. 2. Pneumonia. 3. Congestive heart failure. 4. History of lymphoma. 5. Peripheral neuropathy. 6. Full code.  PLAN:  Agree with IV nitroglycerin as well as IV Lasix.  We will follow up the chest x-ray in the morning as well as an EKG and a BNP and continue to monitor his cardiac enzymes although they do appear to be trending down, it could be that they were elevated secondary to his heart rate on admission.  We will obtain an echocardiogram and will offer further recommendations.  Agree with beta-blocker therapy and we will also begin IV heparin per pharmacy.    ______________________________ Rea [employer], NP  I saw and examined the patient in the morning after reviewing the chart.  I agree with Lenae's findings,  assessment and recommendations. ______________________________ Landry Corporal, MD   LS/MEDQ  D:  07/31/2010  T:  07/31/2010  Job:  [id number]  Electronically Signed by Charmian Muff NP on 09/01/2010 05:26:30 PM Electronically Signed by Bryan Lemma MD on 09/01/2010 07:39:44 PM

## 2010-09-13 NOTE — Discharge Summary (Signed)
NAME:  Antonio Bradley, Antonio Bradley NO.:  [id number]   MEDICAL RECORD NO.:  [id number]          PATIENT TYPE:  INP   LOCATION:  1301                         FACILITY:  [facility]   PHYSICIAN:  Leighton Roach. Truett Perna, M.D. DATE OF BIRTH:  [birth year]   DATE OF ADMISSION:  09/10/2007  DATE OF DISCHARGE:  09/12/2007                               DISCHARGE SUMMARY   CONDITION ON DISCHARGE:  Improved.   DISCHARGE DIAGNOSES:  1. Admission with bilateral leg weakness and pain - most likely      secondary to deconditioning and potentially steroid myopathy.  2. Oral candidiasis - treated with Diflucan beginning on May 14.  3. Non-Hodgkin's lymphoma - status post four cycles of CVP/Rituximab      with the last cycle given on April 23.  A restaging CT on August 15, 2007, confirmed decreased lymphadenopathy.  4. History of prostate cancer.  5. Insomnia and anorexia - question related to depression.  He will      start Remeron at the time of discharge.   HOSPITAL CONSULTATIONS:  None.   HOSPITAL PROCEDURES:  None.   HOSPITAL COURSE:  Mr. Trembath is a 77 year old with a history of non-  Hodgkin's lymphoma.  He has completed four cycles of CVP/Rituximab  therapy with clinical improvement.  The last cycle of chemotherapy was  given on April 23.  Mr. Fabiano presented on May 12 with a complaint of  progressive leg weakness and pain over the preceding several weeks.  On  physical exam, he was noted to have proximal leg weakness and a mild  left foot drop.  He was admitted for further evaluation.  The initial  plan was to obtain a neurology evaluation potentially to include an MRI  of the lumbar spine and EMG/NCV studies.  However, on the day following  admission, the leg strength appeared improved.   The admission sodium level returned low at 127.  The urine sodium was  elevated at 74 suggesting the possibility of SIADH.  A repeat sodium  level returned at 132 on May 14.  We will observe the mild  hyponatremia  for now.   Mr. Mccarey complained of a lack of taste for food.  He was noted to have  mild oral candidiasis.  This will be treated with Diflucan.   He complains of insomnia, generalized weakness, and anorexia.  I am  concerned he has developed depression.  We decided to begin Remeron  therapy.   Mr. Milnes was evaluated by occupational and physical therapy.  He was  able to ambulate with a walker.  He will continue home health physical  therapy.   He was eating and ambulatory on May 14.  His clinical status appeared  improved.  I discussed the situation with his daughter.  A decision was  made to discharge him to home on May 14.   DISCHARGE MEDICATIONS:  1. Remeron 15 mg q.h.s.  2. Diflucan 100 mg daily for four days beginning on May 15.  3. Aspirin 81 mg daily.  4. Vicodin one q.6h. p.r.n.  5. Glucosamine-chondroitin daily.  6. Calcium citrate 2400 mg daily.  7. Relafen 750 mg b.i.d.  8. MiraLax 17 grams daily p.r.n.  9. Compazine 10 mg q.6h. p.r.n.   Follow up care will be with Dr. Truett Perna during the week of May 18.  He  is to call for increased leg weakness or pain.  He will ambulate with a  walker.      Leighton Roach Truett Perna, M.D.  Electronically Signed     GBS/MEDQ  D:  09/12/2007  T:  09/12/2007  Job:  [id number]

## 2010-09-13 NOTE — H&P (Signed)
NAME:  Antonio Bradley NO.:  [id number]   MEDICAL RECORD NO.:  [id number]          PATIENT TYPE:  INP   LOCATION:  1301                         FACILITY:  [facility]   PHYSICIAN:  Leighton Roach. Truett Perna, M.D. DATE OF BIRTH:  [birth year]   DATE OF ADMISSION:  09/10/2007  DATE OF DISCHARGE:                              HISTORY & PHYSICAL   CHIEF COMPLAINT:  Leg weakness and pain.   HISTORY OF PRESENT ILLNESS:  Antonio Bradley is a 77 year old gentleman  diagnosed with non-Hodgkin's lymphoma January 2009 at which time he  presented with bilateral groin area and left flank pain.  Initial CT  scan of the abdomen and pelvis showed a soft-tissue mass in the left  retroperitoneum below the left renal hilum.  Left retroperitoneal  adenopathy was noted.  In the pelvis, adenopathy was seen at the aortic  bifurcation.  Biopsy of a retroperitoneal lymph node on June 10, 2007  showed non-Hodgkin's lymphoma.  Antonio Bradley has now completed 4 cycles of  CVP /Rituxan chemotherapy with improvement on a restaging CT scan August 15, 2007.   Antonio Bradley presents to the office today for routine followup.  He reports  increased leg weakness and bilateral leg pain.  He recently began  working with physical and occupational therapy.  He is ambulating with a  walker.  He is very concerned that he is going to fall.  He describes  the leg pain as aching.   He has had some difficulty with swallowing pills and liquids.  He denies  any nausea or vomiting.  No diplopia.  He denies any back pain.  He has  recently been experiencing nocturia.  He denies any incontinence.  He  has had no fever.  Over the past several nights, he has noted that his  right foot intermittently feels cold.   PAST MEDICAL HISTORY:  1. Non-Hodgkin's lymphoma as outlined above.  2. Gleason 8 prostate cancer with a markedly elevated PSA at      diagnosis, currently maintained on Lupron.   PAST SURGICAL HISTORY:  Hemorrhoid surgery 40 years  ago.   CURRENT MEDICATIONS:  1. Aspirin 81 mg daily.  2. Vicodin 5/500, 1 tablet every 6 hours as needed.  3. Compazine 10 mg every 6 hours as needed.  4. Glucosamine and chondroitin 1500 mg tablet daily.  5. Lupron every 4 months.  6. Citracal 2400 mg daily.  7. Prednisone with chemotherapy.  8. Relafen 750 mg twice daily.  9. MiraLax 17 grams daily as needed.   ALLERGIES:  No known drug allergies.   FAMILY HISTORY:  His father died from cancer in his 44s.  A daughter  died from breast cancer at age 24.  A son died from testicular cancer at  age 52.  A brother had colon cancer.   SOCIAL HISTORY:  Antonio Bradley is married.  He lives in [place].  He is  a retired Curator.  No tobacco or alcohol use.   REVIEW OF SYSTEMS:  Per HPI.   PHYSICAL EXAMINATION:  VITAL SIGNS:  Temperature 98.6, heart rate  75,  respirations 20, blood pressure 123/66, weight 186.3 pounds.  GENERAL:  A frail elderly male in no acute distress.  HEENT:  Pupils equal, round and reactive to light.  Question of subtle  deficit with extraocular movements of the left eye.  Sclerae anicteric.  Mild white coating over tongue.  LYMPHATICS:  No palpable cervical, supraclavicular, or axillary lymph  nodes.  CHEST:  Rales at lower lung fields bilaterally.  CARDIOVASCULAR:  Regular rate and rhythm.  ABDOMEN:  Soft and nontender.  No organomegaly.  EXTREMITIES:  No edema.  Chronic venous stasis changes lower  extremities.  NEUROLOGIC:  Alert and oriented.  Moves all extremities.  Proximal left  leg and left foot weakness.  Upper extremity motor strength is intact.  Minimal right leg weakness.  Gait slightly unsteady.   LABORATORY DATA:  Hemoglobin 11.9, white count 10, absolute neutrophil  count 6.9, platelet count 324,000.   IMPRESSION:  1. Non-Hodgkin's lymphoma status post cycle 4 of CVP/Rituxan August 22, 2007 with improvement on recent restaging CT scan.  2. Left leg weakness/bilateral leg pain -  questionable etiology.  3. Dysphagia.  4. Left abdomen/flank/hip pain - resolved.  5. History of anorexia/weight loss.   PLAN:  1. Neurology consult.  2. Physical and occupational therapy evaluation.  3. Consider MRI lumbar spine, lumbar puncture pending neurology      evaluation.   The patient was interviewed and examined by Dr. Truett Perna; plan per Dr.  Truett Perna.      Antonio Bradley, N.P.      Leighton Roach. Truett Perna, M.D.  Electronically Signed    LT/MEDQ  D:  09/10/2007  T:  09/10/2007  Job:  [id number]   cc:   Leighton Roach. Truett Perna, M.D.  Fax: [phone]

## 2010-09-16 NOTE — Consult Note (Signed)
NAME:  [name], [name] NO.:  [id number]   MEDICAL RECORD NO.:  [id number]          PATIENT TYPE:  EMS   LOCATION:  URG                          FACILITY:  [facility]   PHYSICIAN:  Valetta Mole. Swords, M.D. [facility] OF BIRTH:  [birth year]   DATE OF CONSULTATION:  06/19/2004  DATE OF DISCHARGE:                                   CONSULTATION   REQUESTING PHYSICIAN:  Noelle C. Merilynn Finland, M.D.   I was asked to see Mr. Antonio Bradley by Dr. Merilynn Finland to evaluate a wound on  the patient's left hand.  Two days ago Antonio Bradley was holding his own cat  (shots up to date).  While he was holding his cat a neighbor's dog hit the  cat out of his hand.  The dog's paw hit the dorsal aspect of the patient's  left hand.  The patient wrapped the hand after cleaning it with water and  soap.  There is an abrasion on the left hand.  Today when he took the  bandage off he noticed some swelling and mild erythema.  Came to the urgent  care center for evaluation.  The patient was seen by Dr. Merilynn Finland who asked  me to evaluate the patient.  Antonio Bradley admits to some discomfort over the  dorsal aspect of the left hand around the abrasion.  He admits to some  swelling.  There is no significant erythema according to the patient.  No  streaking of the erythema.  He has had no fevers or chills.   PAST MEDICAL HISTORY:  1.  Hypertension.  2.  Osteoarthritis.   MEDICATIONS:  1.  Benicar 40 mg p.o. daily.  2.  Relafen p.r.n.  3.  Darvocet p.r.n.   ALLERGIES:  No known drug allergies.   SOCIAL HISTORY:  He lives with his wife.  He is a nonsmoker.   REVIEW OF SYSTEMS:  He denies any chest pain, shortness of breath, PND,  fevers, chills, streaking erythema.  No lymphadenopathy.  No other  complaints on full review of systems.   PHYSICAL EXAMINATION:  VITAL SIGNS:  Temperature 98.7, heart rate 72, blood  pressure 144/74, respirations 16, oxygen saturation 96%.  GENERAL:  Well-developed, well-nourished male  in no acute distress.  He  looks remarkably younger than his stated age of 57.  HEENT:  Atraumatic, normocephalic.  Extraocular muscles are intact.  NECK:  Supple without lymphadenopathy, thyromegaly, jugular venous  distention, or carotid bruits.  CHEST:  Clear to auscultation without increased work of breathing.  CARDIAC:  S1, S2 are regular.  ABDOMEN:  Abdomen soft, nontender.  There is no hepatosplenomegaly.  EXTREMITIES:  There is no clubbing, cyanosis, edema.  He has full range of  motion of his wrist and hand.  SKIN:  On inspection of the skin he has a significant abrasion over the  dorsal aspect of his left hand.  There is some soft tissue swelling around  the abrasion which measures 4 x 2.5 cm.  There is no fluctuance.  There is  mild erythema around the area.  No streaking of the  erythema.  LYMPH:  There is no axillary lymphadenopathy.  No epicondylar adenopathy.   LABORATORIES:  I am told an x-ray was performed.  Verbal report is that it  is negative.   ASSESSMENT/PLAN:  Abrasion of left hand.  He also has some scratches on the  palmar aspect of his left hand without any significant erythema.  I do think  he has a localized cellulitis which should be treated aggressively.  He will  be treated with 1 g Rocephin IM in the urgent care center and I will ask  that he be given a dose of Avelox prior to leaving the urgent care center.  I have made an appointment for him in our office at 10:00 tomorrow morning  __________ .      BHS/MEDQ  D:  06/19/2004  T:  06/19/2004  Job:  [id number]   cc:   Stacie Glaze, M.D. [facility]   Noelle C. Merilynn Finland, M.D.  [facility] - Resident - [place], [place] [address]  Fax: [phone]

## 2010-09-20 ENCOUNTER — Telehealth: Payer: Self-pay | Admitting: Internal Medicine

## 2010-09-20 NOTE — Telephone Encounter (Signed)
Gave appointment for June 6 at 11;45=waiting for daughter to return call to [place] so we can tell her

## 2010-09-20 NOTE — Telephone Encounter (Signed)
Pt has just become a resident of [facility] asst living, from skilled nursing, and is needing to get a general checkup asap with Dr Lovell Sheehan. Pt needs work in appt.

## 2010-09-21 NOTE — Telephone Encounter (Signed)
Daughter informed of appointment and she will contact assisted living facitlity

## 2010-10-03 ENCOUNTER — Encounter: Payer: Self-pay | Admitting: Internal Medicine

## 2010-10-05 ENCOUNTER — Encounter: Payer: Self-pay | Admitting: Internal Medicine

## 2010-10-05 ENCOUNTER — Ambulatory Visit (INDEPENDENT_AMBULATORY_CARE_PROVIDER_SITE_OTHER): Payer: Medicare Other | Admitting: Internal Medicine

## 2010-10-05 VITALS — BP 132/70 | HR 76 | Temp 98.2°F | Resp 16 | Ht 68.0 in | Wt 196.0 lb

## 2010-10-05 DIAGNOSIS — Z23 Encounter for immunization: Secondary | ICD-10-CM

## 2010-10-05 DIAGNOSIS — J449 Chronic obstructive pulmonary disease, unspecified: Secondary | ICD-10-CM

## 2010-10-05 DIAGNOSIS — G909 Disorder of the autonomic nervous system, unspecified: Secondary | ICD-10-CM

## 2010-10-05 DIAGNOSIS — Z2911 Encounter for prophylactic immunotherapy for respiratory syncytial virus (RSV): Secondary | ICD-10-CM

## 2010-10-05 DIAGNOSIS — I10 Essential (primary) hypertension: Secondary | ICD-10-CM

## 2010-10-05 NOTE — Progress Notes (Signed)
  Subjective:    Patient ID: Antonio Bradley, male    DOB: 1912/05/14, 77 y.o.   MRN: [id number]  HPI patient is a 77 year old white male who presents following a prolonged hospitalization and rehabilitation stay for pneumonia.  He is now transferred to an assisted living facility has completed rehabilitation and is able to ambulate he did stop basically care for himself  His comorbid problems include peripheral neuropathy which has been painful in his hands and feet, COPD, hypertension and a B12 anemia.      Review of Systems  Constitutional:       Elderly male who has lost weight and no apparent distress  HENT: Positive for hearing loss and congestion.   Eyes: Negative.   Respiratory: Negative.   Cardiovascular: Negative.   Gastrointestinal: Negative.   Genitourinary: Positive for urgency.  Musculoskeletal: Positive for arthralgias and gait problem.  Neurological: Positive for weakness.   Past Medical History  Diagnosis Date  . ANEMIA, B12 DEFICIENCY 12/06/2006  . INSOMNIA, CHRONIC 05/22/2007  . PERIPHERAL AUTONOMIC NEUROPATHY D/O CLASS ELSW 10/27/2008  . HYPERTENSION 11/26/2006  . COPD 12/06/2006  . Slow transit constipation 05/22/2007  . OSTEOARTHROSIS, LOCAL NOS, LOWER LEG 12/12/2006  . Osteoarth NOS-Unspec 12/06/2006  . EDEMA LEG 09/28/2009  . MASS, LUNG 10/07/2009  . Non-Hodgkin lymphoma   . NON-HODGKIN'S LYMPHOMA 09/04/2007  . Prostate ca    Past Surgical History  Procedure Date  . Hemorrhoid surgery   .  skin cancers   . Cataract extraction, bilateral     reports that he has never smoked. He has never used smokeless tobacco. He reports that he does not drink alcohol or use illicit drugs. family history includes Cancer in his father and Pneumonia in his mother. Not on File  Objective:   Physical Exam  Nursing note and vitals reviewed.  On physical examination he is an elderly white male in no apparent distress HEENT reveals arcus senilis pupils were equal round reactive to  light and accommodation oral pharynx was clear his opacities showed some minor irritation of the turbinates.  Neck was supple lung fields are clear to auscultation percussion heart examination revealed a regular rate and rhythm abdomen was soft positive bowel sounds are appreciated in all 4 quadrants extremity examination revealed 1+ edema.  He was oriented to person and place his grips were equal           Assessment & Plan:  We completed all of his forms for his assisted living.  We reviewed his medications and changed his Neurontin from 100 mg 3 times a day to 300 mg 3 times a day for his peripheral neuropathy.  This has been his primary complaints and she went to assisted living his unfiltered are clear indicating a resolution of his pneumonia.  He is due immunizations including a shingles vaccination and one was administered today his COPD is stable his anemia was monitored while at the nursing center in stable he has a history of non-Hodgkin's lymphoma with no signs of recurrence

## 2010-10-21 ENCOUNTER — Other Ambulatory Visit: Payer: Self-pay | Admitting: *Deleted

## 2010-10-21 MED ORDER — METOPROLOL TARTRATE 25 MG PO TABS: ORAL_TABLET | ORAL | Status: DC

## 2010-11-21 ENCOUNTER — Other Ambulatory Visit: Payer: Self-pay | Admitting: *Deleted

## 2010-11-22 ENCOUNTER — Telehealth: Payer: Self-pay | Admitting: Internal Medicine

## 2010-11-22 NOTE — Telephone Encounter (Signed)
Left message on machine for daughter that it is ready for pick up

## 2010-11-22 NOTE — Telephone Encounter (Signed)
Requesting a rx for for a wheeled walker with a seat. Please call daughter when ready for  pickup. Medicare needs a rx for this.     Cell----[phone].

## 2010-11-24 ENCOUNTER — Telehealth: Payer: Self-pay | Admitting: *Deleted

## 2010-11-24 LAB — PROTIME-INR

## 2010-11-24 NOTE — Telephone Encounter (Signed)
Opened in error

## 2010-11-30 ENCOUNTER — Encounter: Payer: Self-pay | Admitting: Internal Medicine

## 2010-12-08 ENCOUNTER — Encounter: Payer: Self-pay | Admitting: Internal Medicine

## 2010-12-27 ENCOUNTER — Emergency Department (HOSPITAL_COMMUNITY)
Admission: EM | Admit: 2010-12-27 | Discharge: 2010-12-27 | Disposition: A | Discharge status: Home / Self Care | Payer: Medicare Other | Attending: Emergency Medicine | Admitting: Emergency Medicine

## 2010-12-27 ENCOUNTER — Emergency Department (HOSPITAL_COMMUNITY): Payer: Medicare Other

## 2010-12-27 DIAGNOSIS — R42 Dizziness and giddiness: Secondary | ICD-10-CM | POA: Insufficient documentation

## 2010-12-27 DIAGNOSIS — R51 Headache: Secondary | ICD-10-CM | POA: Insufficient documentation

## 2010-12-27 DIAGNOSIS — Z87898 Personal history of other specified conditions: Secondary | ICD-10-CM | POA: Insufficient documentation

## 2010-12-27 DIAGNOSIS — Z8546 Personal history of malignant neoplasm of prostate: Secondary | ICD-10-CM | POA: Insufficient documentation

## 2010-12-27 DIAGNOSIS — I498 Other specified cardiac arrhythmias: Secondary | ICD-10-CM | POA: Insufficient documentation

## 2010-12-27 LAB — URINALYSIS, ROUTINE W REFLEX MICROSCOPIC
Bilirubin Urine: NEGATIVE
Glucose, UA: NEGATIVE mg/dL
Ketones, ur: NEGATIVE mg/dL
Leukocytes, UA: NEGATIVE
Protein, ur: NEGATIVE mg/dL
pH: 5.5 (ref 5.0–8.0)

## 2010-12-27 LAB — DIFFERENTIAL
Basophils Absolute: 0.1 10*3/uL (ref 0.0–0.1)
Basophils Relative: 2 % — ABNORMAL HIGH (ref 0–1)
Eosinophils Absolute: 0.4 10*3/uL (ref 0.0–0.7)
Eosinophils Relative: 5 % (ref 0–5)
Monocytes Absolute: 0.8 10*3/uL (ref 0.1–1.0)

## 2010-12-27 LAB — CBC
HCT: 37.2 % — ABNORMAL LOW (ref 39.0–52.0)
Hemoglobin: 12.4 g/dL — ABNORMAL LOW (ref 13.0–17.0)
MCH: 30.8 pg (ref 26.0–34.0)
MCHC: 33.3 g/dL (ref 30.0–36.0)
RDW: 14.3 % (ref 11.5–15.5)

## 2010-12-27 LAB — BASIC METABOLIC PANEL
BUN: 23 mg/dL (ref 6–23)
Calcium: 9.4 mg/dL (ref 8.4–10.5)
Creatinine, Ser: 1.52 mg/dL — ABNORMAL HIGH (ref 0.50–1.35)
GFR calc Af Amer: 52 mL/min — ABNORMAL LOW (ref >60)
GFR calc non Af Amer: 43 mL/min — ABNORMAL LOW (ref >60)
Glucose, Bld: 94 mg/dL (ref 70–99)
Potassium: 4.2 mEq/L (ref 3.5–5.1)

## 2010-12-27 LAB — POCT I-STAT TROPONIN I: Troponin i, poc: 0 ng/mL (ref 0.00–0.08)

## 2011-01-05 ENCOUNTER — Telehealth: Payer: Self-pay | Admitting: *Deleted

## 2011-01-05 NOTE — Telephone Encounter (Signed)
 Place was given order for CDiff with cultures for loose stool.

## 2011-01-08 ENCOUNTER — Emergency Department (HOSPITAL_COMMUNITY)
Admission: EM | Admit: 2011-01-08 | Discharge: 2011-01-09 | Disposition: A | Discharge status: Home / Self Care | Payer: Medicare Other | Attending: Emergency Medicine | Admitting: Emergency Medicine

## 2011-01-08 DIAGNOSIS — Z7901 Long term (current) use of anticoagulants: Secondary | ICD-10-CM | POA: Insufficient documentation

## 2011-01-08 DIAGNOSIS — Z86718 Personal history of other venous thrombosis and embolism: Secondary | ICD-10-CM | POA: Insufficient documentation

## 2011-01-08 DIAGNOSIS — L539 Erythematous condition, unspecified: Secondary | ICD-10-CM | POA: Insufficient documentation

## 2011-01-08 DIAGNOSIS — Z0000 Encounter for general adult medical examination without abnormal findings: Secondary | ICD-10-CM | POA: Insufficient documentation

## 2011-01-08 DIAGNOSIS — Z8546 Personal history of malignant neoplasm of prostate: Secondary | ICD-10-CM | POA: Insufficient documentation

## 2011-01-08 LAB — COMPREHENSIVE METABOLIC PANEL
AST: 23 U/L (ref 0–37)
Albumin: 3.2 g/dL — ABNORMAL LOW (ref 3.5–5.2)
Alkaline Phosphatase: 74 U/L (ref 39–117)
BUN: 21 mg/dL (ref 6–23)
CO2: 24 mEq/L (ref 19–32)
Chloride: 102 mEq/L (ref 96–112)
Creatinine, Ser: 1.6 mg/dL — ABNORMAL HIGH (ref 0.50–1.35)
GFR calc non Af Amer: 40 mL/min — ABNORMAL LOW (ref >60)
Potassium: 4.1 mEq/L (ref 3.5–5.1)
Total Bilirubin: 0.2 mg/dL — ABNORMAL LOW (ref 0.3–1.2)

## 2011-01-08 LAB — DIFFERENTIAL
Basophils Absolute: 0.1 10*3/uL (ref 0.0–0.1)
Eosinophils Relative: 4 % (ref 0–5)
Lymphocytes Relative: 31 % (ref 12–46)
Lymphs Abs: 2.6 10*3/uL (ref 0.7–4.0)
Monocytes Absolute: 1 10*3/uL (ref 0.1–1.0)
Neutro Abs: 4.2 10*3/uL (ref 1.7–7.7)

## 2011-01-08 LAB — LACTIC ACID, PLASMA: Lactic Acid, Venous: 1.5 mmol/L (ref 0.5–2.2)

## 2011-01-08 LAB — CBC
HCT: 36.4 % — ABNORMAL LOW (ref 39.0–52.0)
Hemoglobin: 12.2 g/dL — ABNORMAL LOW (ref 13.0–17.0)
MCHC: 33.5 g/dL (ref 30.0–36.0)
MCV: 92.2 fL (ref 78.0–100.0)
RDW: 14.5 % (ref 11.5–15.5)

## 2011-01-08 LAB — PROTIME-INR: INR: 4.07 — ABNORMAL HIGH (ref 0.00–1.49)

## 2011-01-09 LAB — URINALYSIS, ROUTINE W REFLEX MICROSCOPIC
Glucose, UA: NEGATIVE mg/dL
Hgb urine dipstick: NEGATIVE
Ketones, ur: NEGATIVE mg/dL
Protein, ur: NEGATIVE mg/dL
Urobilinogen, UA: 0.2 mg/dL (ref 0.0–1.0)

## 2011-01-09 NOTE — Telephone Encounter (Signed)
Dr. Lovell Sheehan notified.

## 2011-01-10 ENCOUNTER — Encounter: Payer: Self-pay | Admitting: Internal Medicine

## 2011-01-10 ENCOUNTER — Ambulatory Visit (INDEPENDENT_AMBULATORY_CARE_PROVIDER_SITE_OTHER): Payer: Medicare Other | Admitting: Internal Medicine

## 2011-01-10 DIAGNOSIS — I872 Venous insufficiency (chronic) (peripheral): Secondary | ICD-10-CM

## 2011-01-10 DIAGNOSIS — Z23 Encounter for immunization: Secondary | ICD-10-CM

## 2011-01-10 DIAGNOSIS — R609 Edema, unspecified: Secondary | ICD-10-CM

## 2011-01-10 DIAGNOSIS — I82409 Acute embolism and thrombosis of unspecified deep veins of unspecified lower extremity: Secondary | ICD-10-CM

## 2011-01-10 DIAGNOSIS — R600 Localized edema: Secondary | ICD-10-CM

## 2011-01-10 DIAGNOSIS — I8310 Varicose veins of unspecified lower extremity with inflammation: Secondary | ICD-10-CM

## 2011-01-10 NOTE — Progress Notes (Signed)
Addended by: Azucena Freed on: 01/10/2011 10:20 AM   Modules accepted: Orders

## 2011-01-10 NOTE — Patient Instructions (Addendum)
I want you to wear the compression bandage every day for the next 2 weeks it should be wrapped in the morning and unwrapped before bed. 3 times a week I want you to soak your feet in a gallon of warm water and a cup of white vinegar for about 15 minutes this will help the scaling and the smell of the feet. After 2 weeks he'll begin to use the prescription compression stockings every day.   coumadin dose-Do not take this Wednesday or Thursday only. Then take 3mg . Daily. Check in 4 weeks

## 2011-01-10 NOTE — Progress Notes (Signed)
Subjective:    Patient ID: Antonio Bradley, male    DOB: 1912-06-05, 77 y.o.   MRN: [id number]  HPI The patient is a 77 year old white male who presents after a visit to the emergency room for redness of his right lower extremity.  He was evaluated for potential cellulitis in the emergency room with a normal white cell count and examination revealing venous stasis dermatitis and erythema without cellulitis.  He was referred to our office for followup he denies any fever or chills he has had increased swelling of the lower sternum adjacent to DVT he used to wear compression stockings but has not been wearing them since the DVT he is on Coumadin for his DVT.  He also has been seeing a dermatologist who stated his venous stasis dermatitis with steroid cream.  He has not been using the steroid cream recently   Review of Systems  Constitutional: Negative for fever and fatigue.  HENT: Negative for hearing loss, congestion, neck pain and postnasal drip.   Eyes: Negative for discharge, redness and visual disturbance.  Respiratory: Negative for cough, shortness of breath and wheezing.   Cardiovascular: Negative for leg swelling.  Gastrointestinal: Negative for abdominal pain, constipation and abdominal distention.  Genitourinary: Negative for urgency and frequency.  Musculoskeletal: Negative for joint swelling and arthralgias.  Skin: Positive for color change and rash.  Neurological: Negative for weakness and light-headedness.  Hematological: Negative for adenopathy.  Psychiatric/Behavioral: Negative for behavioral problems.   Past Medical History  Diagnosis Date  . ANEMIA, B12 DEFICIENCY 12/06/2006  . INSOMNIA, CHRONIC 05/22/2007  . PERIPHERAL AUTONOMIC NEUROPATHY D/O CLASS ELSW 10/27/2008  . HYPERTENSION 11/26/2006  . COPD 12/06/2006  . Slow transit constipation 05/22/2007  . OSTEOARTHROSIS, LOCAL NOS, LOWER LEG 12/12/2006  . Osteoarth NOS-Unspec 12/06/2006  . EDEMA LEG 09/28/2009  . MASS, LUNG  10/07/2009  . Non-Hodgkin lymphoma   . NON-HODGKIN'S LYMPHOMA 09/04/2007  . Prostate ca    Past Surgical History  Procedure Date  . Hemorrhoid surgery   .  skin cancers   . Cataract extraction, bilateral     reports that he has never smoked. He has never used smokeless tobacco. He reports that he does not drink alcohol or use illicit drugs. family history includes Cancer in his father and Pneumonia in his mother. No Known Allergies     Objective:   Physical Exam  Constitutional: He appears well-developed and well-nourished.  HENT:  Head: Normocephalic and atraumatic.  Eyes: Conjunctivae are normal. Pupils are equal, round, and reactive to light.  Neck: Normal range of motion. Neck supple.  Cardiovascular: Normal rate and regular rhythm.   Pulmonary/Chest: Effort normal and breath sounds normal.  Abdominal: Soft. Bowel sounds are normal.  Skin:       Venous stasis changes and 2+ edema to the right lower stomach T1 plus edema to the left lower extremity          Assessment & Plan:  Patient has a history of venostasis edema he has worsening venous stasis edema and dermatitis to his right lower extremity the same extremity in which she had a DVT 5 months ago he is on Coumadin therapy for that his INR was elevated at the emergency room and is asked to present to our office for a recheck of his INR as well as to monitor the lower extent of the rash.  I believe this is venous stasis dermatitis and we'll begin by placing an Ace wrap daily for the next 2 weeks  at which time we have achieved appropriate compression will begin his compression stockings he wears compression stockings on daily basis.  This was discussed with the patient patient's daughter orders were given for the nursing home.  INR will be obtained today and orders for his Coumadin based on INR.

## 2011-01-15 LAB — CULTURE, BLOOD (ROUTINE X 2)
Culture  Setup Time: 201209100101
Culture: NO GROWTH
Culture: NO GROWTH

## 2011-01-19 LAB — COMPREHENSIVE METABOLIC PANEL
ALT: 14
AST: 16
Alkaline Phosphatase: 62
CO2: 25
GFR calc Af Amer: >60
GFR calc non Af Amer: 50 — ABNORMAL LOW
Glucose, Bld: 121 — ABNORMAL HIGH
Potassium: 4.3
Sodium: 138

## 2011-01-19 LAB — POCT CARDIAC MARKERS
Operator id: 146091
Troponin i, poc: <0.05

## 2011-01-19 LAB — DIFFERENTIAL
Basophils Relative: 1
Eosinophils Absolute: 0.1
Eosinophils Relative: 2
Monocytes Relative: 10
Neutrophils Relative %: 68

## 2011-01-19 LAB — CBC
Hemoglobin: 11.9 — ABNORMAL LOW
RBC: 3.68 — ABNORMAL LOW
WBC: 7

## 2011-01-20 LAB — CBC
HCT: 32 — ABNORMAL LOW
Hemoglobin: 11.3 — ABNORMAL LOW
MCV: 90.4
Platelets: 288
WBC: 5.2

## 2011-01-20 LAB — APTT: aPTT: 28

## 2011-02-03 LAB — PROTIME-INR

## 2011-02-08 ENCOUNTER — Encounter: Payer: Medicare Other | Admitting: Internal Medicine

## 2011-02-08 ENCOUNTER — Encounter: Payer: Self-pay | Admitting: Internal Medicine

## 2011-02-08 VITALS — BP 130/78 | HR 76 | Temp 98.0°F | Resp 16 | Ht 67.0 in | Wt 198.0 lb

## 2011-02-08 DIAGNOSIS — I10 Essential (primary) hypertension: Secondary | ICD-10-CM

## 2011-02-08 DIAGNOSIS — R609 Edema, unspecified: Secondary | ICD-10-CM

## 2011-02-08 DIAGNOSIS — J449 Chronic obstructive pulmonary disease, unspecified: Secondary | ICD-10-CM

## 2011-02-08 MED ORDER — ASPIRIN 325 MG PO TABS: 325.0000 mg | ORAL_TABLET | Freq: Every day | ORAL | Status: AC

## 2011-02-08 NOTE — Patient Instructions (Addendum)
We're going to stop the Coumadin We are going to stop the 81 mg aspirin and begin a full 325 mg aspirin a day

## 2011-02-26 ENCOUNTER — Encounter: Payer: Self-pay | Admitting: *Deleted

## 2011-03-07 ENCOUNTER — Telehealth: Payer: Self-pay | Admitting: Oncology

## 2011-03-07 ENCOUNTER — Ambulatory Visit (HOSPITAL_BASED_OUTPATIENT_CLINIC_OR_DEPARTMENT_OTHER): Payer: Medicare Other | Admitting: Oncology

## 2011-03-07 VITALS — HR 42 | Temp 96.9°F | Ht 67.0 in | Wt 200.6 lb

## 2011-03-07 DIAGNOSIS — M25569 Pain in unspecified knee: Secondary | ICD-10-CM

## 2011-03-07 DIAGNOSIS — Z8546 Personal history of malignant neoplasm of prostate: Secondary | ICD-10-CM

## 2011-03-07 DIAGNOSIS — C8583 Other specified types of non-Hodgkin lymphoma, intra-abdominal lymph nodes: Secondary | ICD-10-CM

## 2011-03-07 DIAGNOSIS — C8590 Non-Hodgkin lymphoma, unspecified, unspecified site: Secondary | ICD-10-CM

## 2011-03-07 DIAGNOSIS — G609 Hereditary and idiopathic neuropathy, unspecified: Secondary | ICD-10-CM

## 2011-03-07 NOTE — Telephone Encounter (Signed)
gve the pt's caregiver the may 2013  appt calendar

## 2011-03-07 NOTE — Progress Notes (Signed)
OFFICE PROGRESS NOTE   INTERVAL HISTORY:   Antonio Bradley returns as scheduled. He reports being admitted to [facility] with pneumonia earlier this year. He was subsequently diagnosed with a deep vein thrombosis. His daughter reports he took Coumadin for 6 months.   Antonio Bradley complains of numbness and discomfort in the fingers. He is now taking Neurontin. He moved into an assisted living facility.  He has a good appetite and energy level. He denies pain at the left hip and pelvis.  Objective: Vital signs in last 24 hours:  Pulse 42, temperature 96.9 F (36.1 C), temperature source Oral, height 5\' 7"  (1.702 m), weight 200 lb 9.6 oz (90.992 kg).    HEENT: Neck without mass Lymphatics: Prominent bilateral axillary fat pads. No cervical, clavicular, or inguinal nodes. Resp: Lungs with coarse rhonchi at the left base. No respiratory distress. Cardio: Regular rate and rhythm GI: Abdomen soft and nontender. No hepatosplenomegaly. Vascular: No leg edema Neuro: Alert and oriented    Medications: I have reviewed the patient's current medications.  Assessment/Plan:  #1-non-Hodgkin's lymphoma involving a retroperitoneal mass and retroperitoneal/retrocrural adenopathy on a CT 05/31/2007. Status post 4 cycles of CVP/Rituxan with the last chemotherapy given 08/22/2007.  #2-left abdomen, flank, hip pain-improved following chemotherapy  #3-chronic bilateral knee and back pain  #4-history of an irregular heart rate  #5-numbness/tingling in the fingertips with associated pain-a vitamin B 12 level was normal in January 2010. A repeat vitamin B 12 level 09/23/2009 was normal. He is now maintained on Neurontin.  #6-history of prostate cancer followed by Dr. Isabel Caprice  #7-chest x-ray 09/28/2009 with an oval opacity projecting over the lower left midlung zone on the PA image measuring 3.5 x 4.5 cm and on the lateral image an oval opacity projected over the major fissure in the hilar region. Review of the chest  x-rays and a PET/CT scan from 06/16/1998 9000 the density of the chest x-ray to be present on the CT from 06/17/2007 and not significantly changed in size. A repeat chest x-ray on 07/25/2010 was unchanged.  #8-disposition: Antonio Bradley remains in clinical remission from non-Hodgkin's lymphoma. He is being treated by Dr. Lovell Sheehan for neuropathy. It is possible the neuropathy is related to chemotherapy. Mr. Bowden would like to continue followup at the cancer Center. He will return for an office visit in 6 months.     Makynlee Kressin BRADLEY 03/07/2011, 6:58 PM

## 2011-04-26 ENCOUNTER — Emergency Department (HOSPITAL_COMMUNITY)
Admission: EM | Admit: 2011-04-26 | Discharge: 2011-04-26 | Disposition: A | Discharge status: Home / Self Care | Payer: Medicare Other | Attending: Emergency Medicine | Admitting: Emergency Medicine

## 2011-04-26 DIAGNOSIS — W2209XA Striking against other stationary object, initial encounter: Secondary | ICD-10-CM | POA: Insufficient documentation

## 2011-04-26 DIAGNOSIS — Z8546 Personal history of malignant neoplasm of prostate: Secondary | ICD-10-CM | POA: Insufficient documentation

## 2011-04-26 DIAGNOSIS — S61412A Laceration without foreign body of left hand, initial encounter: Secondary | ICD-10-CM

## 2011-04-26 DIAGNOSIS — Z87898 Personal history of other specified conditions: Secondary | ICD-10-CM | POA: Insufficient documentation

## 2011-04-26 DIAGNOSIS — I10 Essential (primary) hypertension: Secondary | ICD-10-CM | POA: Insufficient documentation

## 2011-04-26 DIAGNOSIS — S61409A Unspecified open wound of unspecified hand, initial encounter: Secondary | ICD-10-CM | POA: Insufficient documentation

## 2011-04-26 DIAGNOSIS — R011 Cardiac murmur, unspecified: Secondary | ICD-10-CM | POA: Insufficient documentation

## 2011-04-26 NOTE — ED Notes (Signed)
Hand wrapped with telfa and kerlex.

## 2011-04-27 ENCOUNTER — Telehealth: Payer: Self-pay | Admitting: Internal Medicine

## 2011-04-27 NOTE — ED Provider Notes (Signed)
History     CSN: [id number]  Arrival date & time 04/26/11  2149   First MD Initiated Contact with Patient 04/26/11 2217      Chief Complaint  Patient presents with  . Abrasion    Skin Tear to right hand.  Hit hand on sink    (Consider location/radiation/quality/duration/timing/severity/associated sxs/prior treatment) HPI This 96 ramipril presents immediately following an injury to his right hand. He notes that he was reaching into his pants, avulsed the dorsum of his hand. He denies any significant pain: Events nor any distal numbness, weakness, tingling that is new. Notably the patient has history of neuropathy, with effects in this extremity. Past Medical History  Diagnosis Date  . ANEMIA, B12 DEFICIENCY 12/06/2006  . INSOMNIA, CHRONIC 05/22/2007  . PERIPHERAL AUTONOMIC NEUROPATHY D/O CLASS ELSW 10/27/2008  . HYPERTENSION 11/26/2006  . COPD 12/06/2006  . Slow transit constipation 05/22/2007  . OSTEOARTHROSIS, LOCAL NOS, LOWER LEG 12/12/2006  . Osteoarth NOS-Unspec 12/06/2006  . EDEMA LEG 09/28/2009  . MASS, LUNG 10/07/2009  . NON-HODGKIN'S LYMPHOMA 09/04/2007  . Prostate ca   . Non hodgkins lymphoma     Past Surgical History  Procedure Date  . Hemorrhoid surgery   .  skin cancers   . Cataract extraction, bilateral     Family History  Problem Relation Age of Onset  . Pneumonia Mother   . Cancer Father     History  Substance Use Topics  . Smoking status: Former Smoker    Quit date: 05/01/1950  . Smokeless tobacco: Never Used  . Alcohol Use: No      Review of Systems  All other systems reviewed and are negative.    Allergies  Review of patient's allergies indicates no known allergies.  Home Medications   Current Outpatient Rx  Name Route Sig Dispense Refill  . ACETAMINOPHEN 500 MG PO TABS Oral Take 1,000 mg by mouth every 6 (six) hours as needed. For pain.     . ASPIRIN 325 MG PO TABS Oral Take 1 tablet (325 mg total) by mouth daily.    Marland Kitchen FOLBEE PO Oral Take 1  tablet by mouth daily.     . FUROSEMIDE 20 MG PO TABS Oral Take 20 mg by mouth daily.      Marland Kitchen GABAPENTIN 300 MG PO CAPS Oral Take 300 mg by mouth 3 (three) times daily.      Marland Kitchen METOPROLOL TARTRATE 25 MG PO TABS Oral Take 12.5 mg by mouth 2 (two) times daily.      Marland Kitchen TEMAZEPAM 30 MG PO CAPS Oral Take 30 mg by mouth at bedtime as needed. For insomnia.     Marland Kitchen LUPRON IJ Injection Inject as directed every 4 (four) months.        BP 133/67  Pulse 70  Temp(Src) 98.1 F (36.7 C) (Oral)  SpO2 97%  Physical Exam  Constitutional: He is oriented to person, place, and time. He appears well-developed and well-nourished.  HENT:  Head: Normocephalic and atraumatic.  Eyes: EOM are normal.  Cardiovascular: Normal rate, regular rhythm and intact distal pulses.   Murmur heard. Pulmonary/Chest: Effort normal. No stridor.  Musculoskeletal:       Arms: Neurological: He is alert and oriented to person, place, and time. No cranial nerve deficit.  Skin: Skin is warm and dry.  Psychiatric: He has a normal mood and affect.    ED Course  LACERATION REPAIR Date/Time: 04/26/2011 9:00 PM Performed by: Gerhard Munch Authorized by: Gerhard Munch Consent: Verbal  consent obtained. The procedure was performed in an emergent situation. Risks and benefits: risks, benefits and alternatives were discussed Time out: Immediately prior to procedure a "time out" was called to verify the correct patient, procedure, equipment, support staff and site/side marked as required. Body area: upper extremity Location details: right hand Laceration length: 6 cm Tendon involvement: none Nerve involvement: none Vascular damage: no Patient sedated: no Irrigation solution: saline Debridement: none Degree of undermining: none Skin closure: Steri-Strips Approximation: loose Approximation difficulty: simple Dressing: 4x4 sterile gauze Comments: The patient's wound was not amenable to close approximation, though the wound  edges are within 0.5 cm. Sutures are not appropriate due to the friability of the patient's skin   (including critical care time)  Labs Reviewed - No data to display No results found.   1. Laceration of hand, left       MDM  This generally well elderly male presents with right hand laceration. The wound edges had retracted, though they were approximated with Steri-Strips. The patient's friable skin was not amenable to suture repair. The patient's tetanus status was up to date the patient was discharged in stable condition.        Gerhard Munch, MD 04/27/11 [phone]

## 2011-04-27 NOTE — Telephone Encounter (Signed)
Need a home health referral for skin tear on pts hand. Pt went to ER and info has been faxed to pcp re: this matter. Verbal Order is ok. Pls call.

## 2011-04-28 NOTE — Telephone Encounter (Signed)
Ok per dr Lovell Sheehan- annette given verbal order

## 2011-05-01 ENCOUNTER — Emergency Department (HOSPITAL_COMMUNITY)
Admission: EM | Admit: 2011-05-01 | Discharge: 2011-05-02 | Payer: Medicare Other | Attending: Emergency Medicine | Admitting: Emergency Medicine

## 2011-05-01 ENCOUNTER — Emergency Department (HOSPITAL_COMMUNITY): Payer: Medicare Other

## 2011-05-01 DIAGNOSIS — J111 Influenza due to unidentified influenza virus with other respiratory manifestations: Secondary | ICD-10-CM | POA: Insufficient documentation

## 2011-05-01 DIAGNOSIS — IMO0001 Reserved for inherently not codable concepts without codable children: Secondary | ICD-10-CM | POA: Insufficient documentation

## 2011-05-01 DIAGNOSIS — R059 Cough, unspecified: Secondary | ICD-10-CM | POA: Insufficient documentation

## 2011-05-01 DIAGNOSIS — R5381 Other malaise: Secondary | ICD-10-CM | POA: Insufficient documentation

## 2011-05-01 DIAGNOSIS — R05 Cough: Secondary | ICD-10-CM | POA: Insufficient documentation

## 2011-05-01 DIAGNOSIS — R509 Fever, unspecified: Secondary | ICD-10-CM | POA: Insufficient documentation

## 2011-05-01 NOTE — ED Notes (Signed)
Pt in xray at this time will draw labs when he returns

## 2011-05-01 NOTE — ED Notes (Addendum)
Per EMS: Pt with cough (blood tinged sputum), fever of 105.0 per staff, received 1gm Tylenol at 2100. A&O. Weaker than normal. Ambulatory with assistance on scene. Received flu shot this year. From [facility].

## 2011-05-02 ENCOUNTER — Other Ambulatory Visit: Payer: Self-pay

## 2011-05-02 ENCOUNTER — Encounter (HOSPITAL_COMMUNITY): Payer: Self-pay | Admitting: Emergency Medicine

## 2011-05-02 LAB — CBC
MCH: 31.9 pg (ref 26.0–34.0)
Platelets: 206 10*3/uL (ref 150–400)
RBC: 3.98 MIL/uL — ABNORMAL LOW (ref 4.22–5.81)
WBC: 15.4 10*3/uL — ABNORMAL HIGH (ref 4.0–10.5)

## 2011-05-02 LAB — BASIC METABOLIC PANEL
CO2: 22 mEq/L (ref 19–32)
Calcium: 9.1 mg/dL (ref 8.4–10.5)
Chloride: 102 mEq/L (ref 96–112)
Potassium: 4.3 mEq/L (ref 3.5–5.1)
Sodium: 135 mEq/L (ref 135–145)

## 2011-05-02 MED ORDER — OSELTAMIVIR PHOSPHATE 75 MG PO CAPS: 75.0000 mg | ORAL_CAPSULE | Freq: Every day | ORAL | Status: AC

## 2011-05-02 MED ORDER — OSELTAMIVIR PHOSPHATE 75 MG PO CAPS
75.0000 mg | ORAL_CAPSULE | Freq: Every day | ORAL | Status: DC
  Administered 2011-05-02: 75 mg via ORAL
  Filled 2011-05-02 (×2): qty 1

## 2011-05-02 MED ORDER — SODIUM CHLORIDE 0.9 % IV BOLUS (SEPSIS): 250.0000 mL | Freq: Once | INTRAVENOUS | Status: DC

## 2011-05-02 NOTE — ED Notes (Signed)
Patient is alert and oriented x3.  He and his family have been given DC instructions with MD follow up visits.  Verbal confirmation was given by patient V/S stable.  Patient DC via wheelchair with family. Patient was not showing any signs of distress on DC

## 2011-05-02 NOTE — ED Provider Notes (Signed)
History     CSN: [id number]  Arrival date & time 05/01/11  2322   First MD Initiated Contact with Patient 05/02/11 0100      Chief Complaint  Patient presents with  . Fever    105.0 per SNF  . Cough    Patient is a 78 y.o. male presenting with fever and cough. The history is provided by the patient and a relative.  Fever Primary symptoms of the febrile illness include fever, fatigue, cough and myalgias. Primary symptoms do not include headaches, shortness of breath, abdominal pain, nausea, vomiting or diarrhea. The current episode started today. This is a new problem. The problem has been gradually improving.  Cough Associated symptoms include myalgias. Pertinent negatives include no headaches and no shortness of breath.  improved by - tylenol Worsened by- nothing  Pt presents with fever, cough, fatigue No other complaints He had small amt of blood tinged sputum He is lives in a nursing facility He is ambulatory at baseline   Past Medical History  Diagnosis Date  . ANEMIA, B12 DEFICIENCY 12/06/2006  . INSOMNIA, CHRONIC 05/22/2007  . PERIPHERAL AUTONOMIC NEUROPATHY D/O CLASS ELSW 10/27/2008  . HYPERTENSION 11/26/2006  . COPD 12/06/2006  . Slow transit constipation 05/22/2007  . OSTEOARTHROSIS, LOCAL NOS, LOWER LEG 12/12/2006  . Osteoarth NOS-Unspec 12/06/2006  . EDEMA LEG 09/28/2009  . MASS, LUNG 10/07/2009  . NON-HODGKIN'S LYMPHOMA 09/04/2007  . Prostate ca   . Non hodgkins lymphoma     Past Surgical History  Procedure Date  . Hemorrhoid surgery   .  skin cancers   . Cataract extraction, bilateral     Family History  Problem Relation Age of Onset  . Pneumonia Mother   . Cancer Father     History  Substance Use Topics  . Smoking status: Former Smoker    Quit date: 05/01/1950  . Smokeless tobacco: Never Used  . Alcohol Use: No      Review of Systems  Constitutional: Positive for fever and fatigue.  Respiratory: Positive for cough. Negative for shortness of  breath.   Gastrointestinal: Negative for nausea, vomiting, abdominal pain and diarrhea.  Musculoskeletal: Positive for myalgias.  Neurological: Negative for headaches.  All other systems reviewed and are negative.    Allergies  Review of patient's allergies indicates no known allergies.  Home Medications   Current Outpatient Rx  Name Route Sig Dispense Refill  . ACETAMINOPHEN 500 MG PO TABS Oral Take 1,000 mg by mouth every 6 (six) hours as needed. For pain.     . ASPIRIN 325 MG PO TABS Oral Take 1 tablet (325 mg total) by mouth daily.    Marland Kitchen FOLBEE PO Oral Take 1 tablet by mouth daily.     . FUROSEMIDE 20 MG PO TABS Oral Take 20 mg by mouth daily.      Marland Kitchen GABAPENTIN 300 MG PO CAPS Oral Take 300 mg by mouth 3 (three) times daily.      Marland Kitchen LUPRON IJ Injection Inject as directed every 4 (four) months.      Marland Kitchen METOPROLOL TARTRATE 25 MG PO TABS Oral Take 12.5 mg by mouth 2 (two) times daily.      Marland Kitchen TEMAZEPAM 30 MG PO CAPS Oral Take 30 mg by mouth at bedtime as needed. For insomnia.       BP 122/54  Pulse 59  Temp(Src) 98.9 F (37.2 C) (Oral)  Resp 16  SpO2 96%  Physical Exam  CONSTITUTIONAL: Well developed/well nourished HEAD AND FACE:  Normocephalic/atraumatic EYES: EOMI/PERRL ENMT: Mucous membranes dry, no epistaxis noted, oropharynx clear NECK: supple no meningeal signs SPINE:entire spine nontender CV: S1/S2 noted, no murmurs/rubs/gallops noted LUNGS: Lungs are clear to auscultation bilaterally, no apparent distress ABDOMEN: soft, nontender, no rebound or guarding GU:no cva tenderness NEURO: Pt is awake/alert, moves all extremitiesx4 EXTREMITIES: pulses normal, full ROM SKIN: warm, color normal PSYCH: no abnormalities of mood noted   ED Course  Procedures  Labs Reviewed  CBC - Abnormal; Notable for the following:    WBC 15.4 (*)    RBC 3.98 (*)    Hemoglobin 12.7 (*)    HCT 37.3 (*)    All other components within normal limits  BASIC METABOLIC PANEL - Abnormal;  Notable for the following:    Glucose, Bld 146 (*)    Creatinine, Ser 1.50 (*)    GFR calc non Af Amer 38 (*)    GFR calc Af Amer 43 (*)    All other components within normal limits   Dg Chest 2 View  05/02/2011  *RADIOLOGY REPORT*  Clinical Data: Cough and fever.  Hemoptysis.  CHEST - 2 VIEW  Comparison: Chest x-rays dated 04/10 and 08/03/2010  Findings: Heart size and vascularity are normal.  Slight peribronchial thickening suggestive of bronchitis.  No infiltrates or effusions or acute osseous abnormalities.  IMPRESSION: Bronchitic changes.  Original Report Authenticated By: Gwynn Burly, M.D.    1:24 AM Suspect influenza, tamiflu started  3:00 AM Pt ambulatory, stable, no distress, nontoxic stable for outpatient management and close PCP Followup  MDM  Nursing notes reviewed and considered in documentation All labs/vitals reviewed and considered xrays reviewed and considered        Date: 05/02/2011  Rate: 59  Rhythm: normal sinus rhythm  QRS Axis: right  Intervals: normal  ST/T Wave abnormalities: nonspecific ST changes  Conduction Disutrbances:right bundle branch block  Narrative Interpretation:   Old EKG Reviewed: unchanged    Joya Gaskins, MD 05/02/11 0300

## 2011-05-02 NOTE — ED Notes (Signed)
MD at bedside. 

## 2011-05-08 ENCOUNTER — Telehealth: Payer: Self-pay | Admitting: Family Medicine

## 2011-05-08 NOTE — Telephone Encounter (Signed)
[facility] Triage Call Report Triage Record Num: [id number] Operator: Lauren Hartsell Patient Name: Antonio Bradley Call Date & Time: 05/06/2011 1:38:42PM Patient Phone: [phone] PCP: John Jenkins Patient Gender: Male PCP Fax : [phone] Patient DOB: 01/09/1913 Practice Name: [facility] Reason for Call: Caller: Annette/RN calling from [facility]; PCP: Jenkins, John; CB#: [phone]; Afebrile. Onset 2 weeks Call regarding productive cough with thick light tan-white color sputum. No other acute symptoms at this time. All emergent sxs r/o per Cough Protocol. Care advice given per guidelines. Gave orders for Mucinex (guaifenesin) May take 1-2 600mg tablets BID to help lossen mucus. Advised to have him evaluated on 05/08/11. Protocol(s) Used: Cough - Adult Recommended Outcome per Protocol: See Provider within 24 hours Override Outcome if Used in Protocol: Call Provider within 72 Hours RN Reason for Override Outcome: Rx Standing Orders Used. Reason for Outcome: Productive cough with colored sputum (other than clear or white sputum) Had viral illness with a cough within last month that has not responded to home care Care Advice: Suck on hard candy: Life Savers, lemon drops, etc. (sugar free if diabetic) or herbal throat lozenges may provide relief. ~ ~ Avoid any activity that produces symptoms until evaluated by provider. Call provider if fever greater than 101.5 F (38.6 C) or 100.5 F (38.1C) in an immunocompromised patient (such as diabetes, HIV/AIDS, renal disease, chemotherapy, organ transplant, or chronic steroid use) has not improved in 24 hours. ~ Increase fluids to 8-12 eight oz (1.6 to 2.4 liters) glasses per day, half of them to be water. Soups, popsicles, fruit juices, non-caffeinated sodas (unless restricting sodium intake), jello, broths, decaf teas, etc. are all okay. Warm fluids can be soothing. ~ ~ Warm fluids may help, or try a mixture of honey and  lemon juice in warm tea. Coughing up mucus or phlegm helps to get rid of an infection. A productive cough should not be stopped. A cough medicine with guaifenesin (Robitussin, Mucinex) can help loosen the mucus. Cough medicine with dextromethorphan (DM) should be avoided. Drinking lots of fluids can help loosen the mucus too, especially warm fluids. ~ ~ Call provider if having shortness of breath, coughing up blood-tinged mucus, or a more productive cough. 

## 2011-05-08 NOTE — Telephone Encounter (Signed)
[facility] Triage Call Report Triage Record Num: [id number] Operator: Tana Felts Patient Name: Antonio Bradley Call Date & Time: 05/06/2011 1:38:42PM Patient Phone: [phone] PCP: Darryll Capers Patient Gender: Male PCP Fax : [phone] Patient DOB: March 04, 1913 Practice Name: [facility] Reason for Call: Caller: Annette/RN calling from [facility]; PCP: Darryll Capers; CB#: [phone]; Afebrile. Onset 2 weeks Call regarding productive cough with thick light tan-white color sputum. No other acute symptoms at this time. All emergent sxs r/o per Cough Protocol. Care advice given per guidelines. Gave orders for Mucinex (guaifenesin) May take 1-2 600mg  tablets BID to help lossen mucus. Advised to have him evaluated on 05/08/11. Protocol(s) Used: Cough - Adult Recommended Outcome per Protocol: See Provider within 24 hours Override Outcome if Used in Protocol: Call Provider within 72 Hours RN Reason for Override Outcome: Rx Standing Orders Used. Reason for Outcome: Productive cough with colored sputum (other than clear or white sputum) Had viral illness with a cough within last month that has not responded to home care Care Advice: Suck on hard candy: Life Savers, lemon drops, etc. (sugar free if diabetic) or herbal throat lozenges may provide relief. ~ ~ Avoid any activity that produces symptoms until evaluated by provider. Call provider if fever greater than 101.5 F (38.6 C) or 100.5 F (38.1C) in an immunocompromised patient (such as diabetes, HIV/AIDS, renal disease, chemotherapy, organ transplant, or chronic steroid use) has not improved in 24 hours. ~ Increase fluids to 8-12 eight oz (1.6 to 2.4 liters) glasses per day, half of them to be water. Soups, popsicles, fruit juices, non-caffeinated sodas (unless restricting sodium intake), jello, broths, decaf teas, etc. are all okay. Warm fluids can be soothing. ~ ~ Warm fluids may help, or try a mixture of honey and  lemon juice in warm tea. Coughing up mucus or phlegm helps to get rid of an infection. A productive cough should not be stopped. A cough medicine with guaifenesin (Robitussin, Mucinex) can help loosen the mucus. Cough medicine with dextromethorphan (DM) should be avoided. Drinking lots of fluids can help loosen the mucus too, especially warm fluids. ~ ~ Call provider if having shortness of breath, coughing up blood-tinged mucus, or a more productive cough.

## 2011-05-12 ENCOUNTER — Encounter: Payer: Self-pay | Admitting: Internal Medicine

## 2011-05-12 DIAGNOSIS — C8589 Other specified types of non-Hodgkin lymphoma, extranodal and solid organ sites: Secondary | ICD-10-CM

## 2011-05-12 DIAGNOSIS — I82409 Acute embolism and thrombosis of unspecified deep veins of unspecified lower extremity: Secondary | ICD-10-CM

## 2011-05-12 DIAGNOSIS — R279 Unspecified lack of coordination: Secondary | ICD-10-CM

## 2011-05-12 DIAGNOSIS — G609 Hereditary and idiopathic neuropathy, unspecified: Secondary | ICD-10-CM

## 2011-05-29 ENCOUNTER — Encounter: Payer: Self-pay | Admitting: Internal Medicine

## 2011-05-29 ENCOUNTER — Ambulatory Visit (INDEPENDENT_AMBULATORY_CARE_PROVIDER_SITE_OTHER): Payer: Medicare Other | Admitting: Internal Medicine

## 2011-05-29 VITALS — BP 130/64 | HR 68 | Temp 98.3°F | Resp 16 | Ht 67.0 in | Wt 199.0 lb

## 2011-05-29 DIAGNOSIS — R609 Edema, unspecified: Secondary | ICD-10-CM

## 2011-05-29 DIAGNOSIS — D649 Anemia, unspecified: Secondary | ICD-10-CM

## 2011-05-29 DIAGNOSIS — I10 Essential (primary) hypertension: Secondary | ICD-10-CM

## 2011-05-29 DIAGNOSIS — R351 Nocturia: Secondary | ICD-10-CM

## 2011-05-29 LAB — CBC WITH DIFFERENTIAL/PLATELET
Basophils Relative: 0.7 % (ref 0.0–3.0)
Eosinophils Relative: 3.2 % (ref 0.0–5.0)
HCT: 37.6 % — ABNORMAL LOW (ref 39.0–52.0)
Lymphs Abs: 2.5 10*3/uL (ref 0.7–4.0)
Monocytes Relative: 12.5 % — ABNORMAL HIGH (ref 3.0–12.0)
Neutrophils Relative %: 53 % (ref 43.0–77.0)
Platelets: 231 10*3/uL (ref 150.0–400.0)
RBC: 3.91 Mil/uL — ABNORMAL LOW (ref 4.22–5.81)
WBC: 8.2 10*3/uL (ref 4.5–10.5)

## 2011-05-29 LAB — IRON: Iron: 73 ug/dL (ref 42–165)

## 2011-05-29 MED ORDER — CYANOCOBALAMIN 1000 MCG/ML IJ SOLN
1000.0000 ug | Freq: Once | INTRAMUSCULAR | Status: AC
  Administered 2011-05-29: 1000 ug via INTRAMUSCULAR

## 2011-05-29 MED ORDER — TAMSULOSIN HCL 0.4 MG PO CAPS: 0.4000 mg | ORAL_CAPSULE | Freq: Every day | ORAL | Status: DC

## 2011-05-29 NOTE — Progress Notes (Signed)
Subjective:    Patient ID: Antonio Bradley, male    DOB: [birth year], 78 y.o.   MRN: [id number]  HPI Patient is a 78 year old male who presents for followup of his hypertension and anemia.  He has had several emergency room visits in the past 60 days including one for a flulike illness and one for a laceration on the day after Christmas. Generally he is doing well has been taking his medicines and his blood pressure is well-controlled his weight is stable and he will have some routine blood work done today for monitoring purposes   Review of Systems  Constitutional: Positive for activity change, appetite change, fatigue and unexpected weight change.  HENT: Positive for rhinorrhea.   Eyes: Negative for photophobia and visual disturbance.  Respiratory: Negative for chest tightness and shortness of breath.   Genitourinary: Positive for urgency and enuresis.  Musculoskeletal: Positive for gait problem.  Neurological: Positive for weakness and headaches.  Psychiatric/Behavioral: Positive for decreased concentration.       Past Medical History  Diagnosis Date  . ANEMIA, B12 DEFICIENCY 12/06/2006  . INSOMNIA, CHRONIC 05/22/2007  . PERIPHERAL AUTONOMIC NEUROPATHY D/O CLASS ELSW 10/27/2008  . HYPERTENSION 11/26/2006  . COPD 12/06/2006  . Slow transit constipation 05/22/2007  . OSTEOARTHROSIS, LOCAL NOS, LOWER LEG 12/12/2006  . Osteoarth NOS-Unspec 12/06/2006  . EDEMA LEG 09/28/2009  . MASS, LUNG 10/07/2009  . NON-HODGKIN'S LYMPHOMA 09/04/2007  . Prostate ca   . Non hodgkins lymphoma     History   Social History  . Marital Status: Married    Spouse Name: N/A    Number of Children: N/A  . Years of Education: N/A   Occupational History  . Not on file.   Social History Main Topics  . Smoking status: Former Smoker    Quit date: 05/01/1950  . Smokeless tobacco: Never Used  . Alcohol Use: No  . Drug Use: No  . Sexually Active: No   Other Topics Concern  . Not on file   Social History  Narrative  . No narrative on file    Past Surgical History  Procedure Date  . Hemorrhoid surgery   .  skin cancers   . Cataract extraction, bilateral     Family History  Problem Relation Age of Onset  . Pneumonia Mother   . Cancer Father     No Known Allergies  Current Outpatient Prescriptions on File Prior to Visit  Medication Sig Dispense Refill  . acetaminophen (TYLENOL) 500 MG tablet Take 1,000 mg by mouth every 6 (six) hours as needed. For pain.       Marland Kitchen aspirin 325 MG tablet Take 1 tablet (325 mg total) by mouth daily.      . Folic Acid-Vit B6-Vit B12 (FOLBEE PO) Take 1 tablet by mouth daily.       . furosemide (LASIX) 20 MG tablet Take 20 mg by mouth daily.        Marland Kitchen gabapentin (NEURONTIN) 300 MG capsule Take 300 mg by mouth 3 (three) times daily.        Marland Kitchen Leuprolide Acetate (LUPRON IJ) Inject as directed every 6 (six) months.       . metoprolol tartrate (LOPRESSOR) 25 MG tablet Take 12.5 mg by mouth 2 (two) times daily.          BP 130/64  Pulse 68  Temp 98.3 F (36.8 C)  Resp 16  Ht 5\' 7"  (1.702 m)  Wt 199 lb (90.266 kg)  BMI 31.17 kg/m2  Objective:   Physical Exam  Nursing note and vitals reviewed. Constitutional: He appears well-developed and well-nourished.  HENT:  Head: Normocephalic and atraumatic.  Eyes: Conjunctivae are normal. Pupils are equal, round, and reactive to light.  Neck: Normal range of motion. Neck supple.  Cardiovascular: Normal rate and regular rhythm.   Pulmonary/Chest: Effort normal and breath sounds normal.  Abdominal: Soft. Bowel sounds are normal.  Musculoskeletal: He exhibits edema and tenderness.  Skin: Skin is warm and dry.          Assessment & Plan:  Patient's exam is stable there is no evidence of congestive heart failure we will monitor his anemia today and his B12 levels After his blood work he will have a B12 shot We will see him back in 4 months

## 2011-05-29 NOTE — Patient Instructions (Signed)
The patient is instructed to continue all medications as prescribed. Schedule followup with check out clerk upon leaving the clinic  

## 2011-06-05 ENCOUNTER — Telehealth: Payer: Self-pay | Admitting: *Deleted

## 2011-06-05 DIAGNOSIS — R351 Nocturia: Secondary | ICD-10-CM

## 2011-06-05 MED ORDER — TAMSULOSIN HCL 0.4 MG PO CAPS: 0.4000 mg | ORAL_CAPSULE | Freq: Every day | ORAL | Status: DC

## 2011-06-05 MED ORDER — MELATONIN 3 MG PO CAPS: 3.0000 mg | ORAL_CAPSULE | Freq: Every day | ORAL | Status: AC

## 2011-06-05 NOTE — Telephone Encounter (Signed)
Pt saw Dr. Lovell Sheehan a week ago and was prescribed new medication.  One medication to assist with sleeping and one to decrease urination at night.  Need orders sent to [facility] as pt states he is not receiving these meds yet.

## 2011-07-03 ENCOUNTER — Telehealth: Payer: Self-pay

## 2011-07-03 NOTE — Telephone Encounter (Signed)
Per Drinda Butts pt has been treated for skin tears and blisters on his right lower leg.  As soon as one blister is healed another one pops up outside the bandage.  Pt had not been complaining until this weekend.  Pt complained for pain and burning.  Per Drinda Butts there are yellow, fluid-filled blisters and pt may have cellulitis.  Pls advise.  Rx can be faxed to [phone]

## 2011-07-03 NOTE — Telephone Encounter (Signed)
Antonio Bradley informed and she will apply unaboots

## 2011-07-03 NOTE — Telephone Encounter (Signed)
second note was in eroor- put on wrong chart--per dr Lovell Sheehan- have home health come out and apply una boot twice a week for 4 weeks- will call annette and send order to terri

## 2011-07-03 NOTE — Telephone Encounter (Signed)
Tried to call and tried to fax refill- phones are down- pt notified

## 2011-07-04 DIAGNOSIS — R279 Unspecified lack of coordination: Secondary | ICD-10-CM

## 2011-07-04 DIAGNOSIS — G609 Hereditary and idiopathic neuropathy, unspecified: Secondary | ICD-10-CM

## 2011-07-04 DIAGNOSIS — L97809 Non-pressure chronic ulcer of other part of unspecified lower leg with unspecified severity: Secondary | ICD-10-CM

## 2011-07-04 DIAGNOSIS — I872 Venous insufficiency (chronic) (peripheral): Secondary | ICD-10-CM

## 2011-07-10 ENCOUNTER — Emergency Department (HOSPITAL_COMMUNITY)
Admission: EM | Admit: 2011-07-10 | Discharge: 2011-07-10 | Disposition: A | Discharge status: Home / Self Care | Payer: Medicare Other | Attending: Emergency Medicine | Admitting: Emergency Medicine

## 2011-07-10 ENCOUNTER — Encounter (HOSPITAL_COMMUNITY): Payer: Self-pay | Admitting: Emergency Medicine

## 2011-07-10 ENCOUNTER — Emergency Department (HOSPITAL_COMMUNITY): Payer: Medicare Other

## 2011-07-10 ENCOUNTER — Telehealth: Payer: Self-pay | Admitting: Internal Medicine

## 2011-07-10 DIAGNOSIS — R509 Fever, unspecified: Secondary | ICD-10-CM | POA: Insufficient documentation

## 2011-07-10 DIAGNOSIS — I10 Essential (primary) hypertension: Secondary | ICD-10-CM | POA: Insufficient documentation

## 2011-07-10 DIAGNOSIS — R059 Cough, unspecified: Secondary | ICD-10-CM | POA: Insufficient documentation

## 2011-07-10 DIAGNOSIS — M1990 Unspecified osteoarthritis, unspecified site: Secondary | ICD-10-CM | POA: Insufficient documentation

## 2011-07-10 DIAGNOSIS — R05 Cough: Secondary | ICD-10-CM | POA: Insufficient documentation

## 2011-07-10 DIAGNOSIS — J189 Pneumonia, unspecified organism: Secondary | ICD-10-CM

## 2011-07-10 DIAGNOSIS — Z87898 Personal history of other specified conditions: Secondary | ICD-10-CM | POA: Insufficient documentation

## 2011-07-10 DIAGNOSIS — J4489 Other specified chronic obstructive pulmonary disease: Secondary | ICD-10-CM | POA: Insufficient documentation

## 2011-07-10 DIAGNOSIS — J449 Chronic obstructive pulmonary disease, unspecified: Secondary | ICD-10-CM | POA: Insufficient documentation

## 2011-07-10 LAB — BASIC METABOLIC PANEL
BUN: 19 mg/dL (ref 6–23)
CO2: 24 mEq/L (ref 19–32)
Chloride: 101 mEq/L (ref 96–112)
Creatinine, Ser: 1.52 mg/dL — ABNORMAL HIGH (ref 0.50–1.35)
Glucose, Bld: 104 mg/dL — ABNORMAL HIGH (ref 70–99)

## 2011-07-10 LAB — CBC
HCT: 35.5 % — ABNORMAL LOW (ref 39.0–52.0)
Hemoglobin: 12.4 g/dL — ABNORMAL LOW (ref 13.0–17.0)
WBC: 8.5 10*3/uL (ref 4.0–10.5)

## 2011-07-10 LAB — DIFFERENTIAL
Lymphocytes Relative: 17 % (ref 12–46)
Monocytes Absolute: 1.2 10*3/uL — ABNORMAL HIGH (ref 0.1–1.0)
Monocytes Relative: 14 % — ABNORMAL HIGH (ref 3–12)
Neutro Abs: 5.8 10*3/uL (ref 1.7–7.7)

## 2011-07-10 MED ORDER — BENZONATATE 100 MG PO CAPS: 100.0000 mg | ORAL_CAPSULE | Freq: Three times a day (TID) | ORAL | Status: AC | PRN

## 2011-07-10 MED ORDER — LEVOFLOXACIN 500 MG PO TABS: 500.0000 mg | ORAL_TABLET | Freq: Every day | ORAL | Status: AC

## 2011-07-10 MED ORDER — LEVOFLOXACIN IN D5W 750 MG/150ML IV SOLN
750.0000 mg | Freq: Once | INTRAVENOUS | Status: AC
  Administered 2011-07-10: 750 mg via INTRAVENOUS
  Filled 2011-07-10: qty 150

## 2011-07-10 MED ORDER — BENZONATATE 100 MG PO CAPS: 100.0000 mg | ORAL_CAPSULE | Freq: Three times a day (TID) | ORAL | Status: DC | PRN

## 2011-07-10 NOTE — ED Notes (Signed)
EMS brings pt from [facility] on [address], with fever chill and cough x 2 days.

## 2011-07-10 NOTE — ED Provider Notes (Addendum)
History     CSN: [id number]  Arrival date & time 07/10/11  4098   First MD Initiated Contact with Patient 07/10/11 [phone]      Chief Complaint  Patient presents with  . Fever    (Consider location/radiation/quality/duration/timing/severity/associated sxs/prior treatment) HPI Comments: The patient is a 78 year old male who presents for evaluation of a cough, nonproductive, for 2 days. The nursing home reported a "low-grade fever and "measured this morning, and patient is afebrile on emergency department arrival. The patient is in no apparent distress, respiratory or otherwise.  Patient is a 78 y.o. male presenting with fever. The history is provided by the patient and the nursing home.  Fever Primary symptoms of the febrile illness include fever and cough. Primary symptoms do not include fatigue, headaches, wheezing, shortness of breath, abdominal pain, nausea, vomiting, diarrhea, dysuria, altered mental status, myalgias, arthralgias or rash. The current episode started 2 days ago. This is a new problem. The problem has not changed since onset. The fever began today. The fever has been resolved since its onset. Maximum temperature: Nursing home RN reported "low grade fever", patient without fever in ED. The temperature was taken by an oral thermometer.  The cough began 2 days ago. The cough is new. The cough is non-productive. Cough worsened by: Nothing.  Risk factors: The patient resides in a nursing facility.   Past Medical History  Diagnosis Date  . ANEMIA, B12 DEFICIENCY 12/06/2006  . INSOMNIA, CHRONIC 05/22/2007  . PERIPHERAL AUTONOMIC NEUROPATHY D/O CLASS ELSW 10/27/2008  . HYPERTENSION 11/26/2006  . COPD 12/06/2006  . Slow transit constipation 05/22/2007  . OSTEOARTHROSIS, LOCAL NOS, LOWER LEG 12/12/2006  . Osteoarth NOS-Unspec 12/06/2006  . EDEMA LEG 09/28/2009  . MASS, LUNG 10/07/2009  . NON-HODGKIN'S LYMPHOMA 09/04/2007  . Prostate ca   . Non hodgkins lymphoma     Past Surgical  History  Procedure Date  . Hemorrhoid surgery   .  skin cancers   . Cataract extraction, bilateral     Family History  Problem Relation Age of Onset  . Pneumonia Mother   . Cancer Father     History  Substance Use Topics  . Smoking status: Former Smoker    Quit date: 05/01/1950  . Smokeless tobacco: Never Used  . Alcohol Use: No      Review of Systems  Constitutional: Positive for fever and chills. Negative for diaphoresis, activity change, appetite change, fatigue and unexpected weight change.  HENT: Positive for congestion, rhinorrhea and postnasal drip. Negative for hearing loss, ear pain, nosebleeds, sore throat, facial swelling, sneezing, drooling, mouth sores, trouble swallowing, neck pain, neck stiffness, dental problem, voice change, sinus pressure, tinnitus and ear discharge.   Eyes: Negative for photophobia, pain, discharge, redness and itching.  Respiratory: Positive for cough. Negative for choking, chest tightness, shortness of breath, wheezing and stridor.   Cardiovascular: Negative for chest pain, palpitations and leg swelling.  Gastrointestinal: Negative for nausea, vomiting, abdominal pain, diarrhea, constipation, blood in stool, abdominal distention and anal bleeding.  Genitourinary: Negative for dysuria, urgency, frequency, hematuria, flank pain and difficulty urinating.  Musculoskeletal: Negative for myalgias, back pain, joint swelling, arthralgias and gait problem.  Skin: Negative for color change, pallor, rash and wound.  Neurological: Negative for dizziness, weakness, light-headedness and headaches.  Hematological: Negative for adenopathy. Does not bruise/bleed easily.  Psychiatric/Behavioral: Negative.  Negative for altered mental status.    Allergies  Review of patient's allergies indicates no known allergies.  Home Medications   Current Outpatient Rx  Name Route Sig Dispense Refill  . ASPIRIN 325 MG PO TABS Oral Take 1 tablet (325 mg total) by  mouth daily.    . CEPHALEXIN 500 MG PO CAPS Oral Take 500 mg by mouth 4 (four) times daily.    Marland Kitchen FOLBEE PO Oral Take 1 tablet by mouth daily.     . FUROSEMIDE 20 MG PO TABS Oral Take 20 mg by mouth daily.      Marland Kitchen GABAPENTIN 300 MG PO CAPS Oral Take 300 mg by mouth 3 (three) times daily.      . GUAIFENESIN ER 600 MG PO TB12 Oral Take 600-1,200 mg by mouth 2 (two) times daily. Cough and congestion    . MAGNESIUM HYDROXIDE 400 MG/5ML PO SUSP Oral Take 30 mLs by mouth daily as needed. constipation    . MELATONIN 3 MG PO CAPS Oral Take 1 capsule (3 mg total) by mouth at bedtime. 30 capsule 6  . METOPROLOL TARTRATE 25 MG PO TABS Oral Take 12.5 mg by mouth 2 (two) times daily.      . DISPOSABLE ENEMA 19-7 GM/118ML RE ENEM Rectal Place 1 enema rectally once. follow package directions    . TAMSULOSIN HCL 0.4 MG PO CAPS Oral Take 1 capsule (0.4 mg total) by mouth daily. 30 capsule 6  . TEMAZEPAM 30 MG PO CAPS Oral Take 30 mg by mouth at bedtime as needed. For insomnia.     . ACETAMINOPHEN 500 MG PO TABS Oral Take 1,000 mg by mouth every 6 (six) hours as needed. For pain.     Marland Kitchen LUPRON IJ Injection Inject as directed every 6 (six) months.       BP 138/91  Pulse 88  Temp(Src) 99.2 F (37.3 C) (Oral)  SpO2 96%  Physical Exam  Nursing note and vitals reviewed. Constitutional: He is oriented to person, place, and time. He appears well-developed and well-nourished. No distress.  HENT:  Head: Normocephalic and atraumatic. Head is without raccoon's eyes, without Battle's sign, without right periorbital erythema and without left periorbital erythema.  Right Ear: Hearing, tympanic membrane, external ear and ear canal normal.  Left Ear: Hearing, tympanic membrane, external ear and ear canal normal.  Nose: Rhinorrhea present. Right sinus exhibits no maxillary sinus tenderness and no frontal sinus tenderness. Left sinus exhibits no maxillary sinus tenderness and no frontal sinus tenderness.  Mouth/Throat: Uvula  is midline, oropharynx is clear and moist and mucous membranes are normal. Mucous membranes are not dry. No oropharyngeal exudate, posterior oropharyngeal edema, posterior oropharyngeal erythema or tonsillar abscesses.  Eyes: Conjunctivae and EOM are normal.  Neck: Normal range of motion. Neck supple. No JVD present. No tracheal deviation present.  Cardiovascular: Normal rate, regular rhythm, normal heart sounds and intact distal pulses.  Exam reveals no gallop and no friction rub.   No murmur heard. Pulmonary/Chest: Effort normal. No accessory muscle usage or stridor. Not tachypneic. No respiratory distress. He has no wheezes. He has rales in the right lower field and the left lower field. He exhibits no tenderness.  Abdominal: Soft. Bowel sounds are normal. He exhibits no distension. There is no tenderness. There is no rebound and no guarding.  Musculoskeletal: Normal range of motion. He exhibits no edema and no tenderness.  Lymphadenopathy:    He has no cervical adenopathy.  Neurological: He is alert and oriented to person, place, and time. He has normal reflexes. No cranial nerve deficit. He exhibits normal muscle tone. Coordination normal.  Skin: Skin is warm and dry. No  rash noted. He is not diaphoretic. No erythema. No pallor.  Psychiatric: He has a normal mood and affect. His behavior is normal. Judgment and thought content normal.    ED Course  Procedures (including critical care time)   Labs Reviewed  CBC  DIFFERENTIAL  BASIC METABOLIC PANEL   No results found.   No diagnosis found.    MDM  The patient has a nonproductive cough with nasal congestion, rhinorrhea, and postnasal drip. He has no fever. He is having no chest pain or palpitations and no lower extremity edema. He likely has an upper respiratory tract infection, probably viral, however given his advanced age and his residence in a nursing home, pneumonia is also a consideration. I will obtain a chest x-ray to  evaluate for pneumonia. The patient is in no apparent respiratory distress and has normal oxygen saturations on room air. I expect that the patient will be able to be treated as an outpatient.        Felisa Bonier, MD 07/10/11 1003  Felisa Bonier, MD 07/10/11 1359

## 2011-07-10 NOTE — Telephone Encounter (Signed)
Antonio Bradley was taken to the hospital today with a temp of 100.5 and chills. Harriett Sine from nursing home needs a return call in regard to  Leuprolide injections. Please contact

## 2011-07-10 NOTE — Discharge Instructions (Signed)

## 2011-07-10 NOTE — Telephone Encounter (Signed)
Per. Dr. Lovell Sheehan Lupron would be prescribed by Urology. Notified Nursing Home Harriett Sine).

## 2011-07-12 ENCOUNTER — Encounter: Payer: Self-pay | Admitting: Family

## 2011-07-12 ENCOUNTER — Ambulatory Visit (INDEPENDENT_AMBULATORY_CARE_PROVIDER_SITE_OTHER): Payer: Medicare Other | Admitting: Family

## 2011-07-12 VITALS — BP 138/60 | HR 85 | Ht 67.0 in | Wt 205.0 lb

## 2011-07-12 DIAGNOSIS — K5901 Slow transit constipation: Secondary | ICD-10-CM

## 2011-07-12 NOTE — Progress Notes (Signed)
Subjective:    Patient ID: Antonio Bradley, male    DOB: 1912/10/15, 78 y.o.   MRN: [id number]  HPI 78 year old white male, nonsmoker, patient of Dr. Lovell Sheehan is in as an emergency department followup. He was diagnosed with pneumonia by chest x-ray. Is currently taking Levaquin 500 mg a day and the symptoms have significantly improved. He has less fatigue. Continues to have a cough that is productive of clear to yellow phlegm. Patient denies any lightheadedness, dizziness, chest pain, palpitations, shortness of breath or edema.  Patient has a history of hypertension, COPD, non-Hodgkin's lymphoma, B12 deficiency but is doing well on all medications. No concerns.   Review of Systems  Constitutional: Positive for fatigue.  HENT: Negative.   Respiratory: Positive for cough.   Cardiovascular: Negative.   Gastrointestinal: Negative.   Musculoskeletal: Negative.   Skin: Negative.   Neurological: Negative.   Hematological: Negative.   Psychiatric/Behavioral: Negative.    Past Medical History  Diagnosis Date  . ANEMIA, B12 DEFICIENCY 12/06/2006  . INSOMNIA, CHRONIC 05/22/2007  . PERIPHERAL AUTONOMIC NEUROPATHY D/O CLASS ELSW 10/27/2008  . HYPERTENSION 11/26/2006  . COPD 12/06/2006  . Slow transit constipation 05/22/2007  . OSTEOARTHROSIS, LOCAL NOS, LOWER LEG 12/12/2006  . Osteoarth NOS-Unspec 12/06/2006  . EDEMA LEG 09/28/2009  . MASS, LUNG 10/07/2009  . NON-HODGKIN'S LYMPHOMA 09/04/2007  . Prostate ca   . Non hodgkins lymphoma     History   Social History  . Marital Status: Married    Spouse Name: N/A    Number of Children: N/A  . Years of Education: N/A   Occupational History  . Not on file.   Social History Main Topics  . Smoking status: Former Smoker    Quit date: 05/01/1950  . Smokeless tobacco: Never Used  . Alcohol Use: No  . Drug Use: No  . Sexually Active: No   Other Topics Concern  . Not on file   Social History Narrative  . No narrative on file    Past Surgical  History  Procedure Date  . Hemorrhoid surgery   .  skin cancers   . Cataract extraction, bilateral     Family History  Problem Relation Age of Onset  . Pneumonia Mother   . Cancer Father     No Known Allergies  Current Outpatient Prescriptions on File Prior to Visit  Medication Sig Dispense Refill  . acetaminophen (TYLENOL) 500 MG tablet Take 1,000 mg by mouth every 6 (six) hours as needed. For pain.       Marland Kitchen aspirin 325 MG tablet Take 1 tablet (325 mg total) by mouth daily.      . benzonatate (TESSALON) 100 MG capsule Take 1 capsule (100 mg total) by mouth 3 (three) times daily as needed for cough.  21 capsule  0  . cephALEXin (KEFLEX) 500 MG capsule Take 500 mg by mouth 4 (four) times daily.      . Folic Acid-Vit B6-Vit B12 (FOLBEE PO) Take 1 tablet by mouth daily.       . furosemide (LASIX) 20 MG tablet Take 20 mg by mouth daily.        Marland Kitchen gabapentin (NEURONTIN) 300 MG capsule Take 300 mg by mouth 3 (three) times daily.        Marland Kitchen guaiFENesin (MUCINEX) 600 MG 12 hr tablet Take 600-1,200 mg by mouth 2 (two) times daily as needed. Cough and congestion      . Leuprolide Acetate (LUPRON IJ) Inject as directed every 6 (six)  months.       Marland Kitchen levofloxacin (LEVAQUIN) 500 MG tablet Take 1 tablet (500 mg total) by mouth daily.  7 tablet  0  . magnesium hydroxide (MILK OF MAGNESIA) 400 MG/5ML suspension Take 30 mLs by mouth daily as needed. constipation      . Melatonin 3 MG CAPS Take 1 capsule (3 mg total) by mouth at bedtime.  30 capsule  6  . metoprolol tartrate (LOPRESSOR) 25 MG tablet Take 12.5 mg by mouth 2 (two) times daily.        . sodium phosphate (FLEET) enema Place 1 enema rectally once. follow package directions      . Tamsulosin HCl (FLOMAX) 0.4 MG CAPS Take 1 capsule (0.4 mg total) by mouth daily.  30 capsule  6  . temazepam (RESTORIL) 30 MG capsule Take 30 mg by mouth at bedtime as needed. For insomnia.         BP 138/60  Pulse 85  Ht 5\' 7"  (1.702 m)  Wt 205 lb (92.987 kg)   BMI 32.11 kg/m2  SpO2 94%chart    Objective:   Physical Exam  Constitutional: He is oriented to person, place, and time. He appears well-developed and well-nourished.  HENT:  Right Ear: External ear normal.  Left Ear: External ear normal.  Nose: Nose normal.  Mouth/Throat: Oropharynx is clear and moist.  Neck: Normal range of motion. Neck supple.  Cardiovascular: Regular rhythm and normal heart sounds.   Pulmonary/Chest: Effort normal and breath sounds normal.  Abdominal: Soft. Bowel sounds are normal.  Musculoskeletal: Normal range of motion.  Neurological: He is alert and oriented to person, place, and time.  Skin: Skin is warm and dry.  Psychiatric: He has a normal mood and affect.          Assessment & Plan:  Assessment: Pneumonia, cough, hypertension  Plan: Continue Levaquin 500 mg. DC cephalexin. Rest. Drink plenty of fluids. Patient about the quality of the symptoms worsen or persist, recheck with Dr. Lovell Sheehan and schedule this on a when necessary.

## 2011-07-12 NOTE — Patient Instructions (Signed)

## 2011-07-25 ENCOUNTER — Other Ambulatory Visit: Payer: Self-pay | Admitting: *Deleted

## 2011-07-25 MED ORDER — TEMAZEPAM 30 MG PO CAPS: 30.0000 mg | ORAL_CAPSULE | Freq: Every evening | ORAL | Status: DC | PRN

## 2011-08-14 DIAGNOSIS — I872 Venous insufficiency (chronic) (peripheral): Secondary | ICD-10-CM

## 2011-08-14 DIAGNOSIS — R279 Unspecified lack of coordination: Secondary | ICD-10-CM

## 2011-08-14 DIAGNOSIS — L97809 Non-pressure chronic ulcer of other part of unspecified lower leg with unspecified severity: Secondary | ICD-10-CM

## 2011-08-14 DIAGNOSIS — G609 Hereditary and idiopathic neuropathy, unspecified: Secondary | ICD-10-CM

## 2011-08-25 ENCOUNTER — Other Ambulatory Visit: Payer: Self-pay | Admitting: *Deleted

## 2011-08-25 MED ORDER — TEMAZEPAM 30 MG PO CAPS: 30.0000 mg | ORAL_CAPSULE | Freq: Every evening | ORAL | Status: DC | PRN

## 2011-08-31 ENCOUNTER — Encounter (HOSPITAL_COMMUNITY): Payer: Self-pay | Admitting: *Deleted

## 2011-08-31 ENCOUNTER — Emergency Department (HOSPITAL_COMMUNITY): Payer: Medicare Other

## 2011-08-31 ENCOUNTER — Emergency Department (HOSPITAL_COMMUNITY)
Admission: EM | Admit: 2011-08-31 | Discharge: 2011-08-31 | Disposition: A | Discharge status: Home / Self Care | Payer: Medicare Other | Attending: Emergency Medicine | Admitting: Emergency Medicine

## 2011-08-31 DIAGNOSIS — W19XXXA Unspecified fall, initial encounter: Secondary | ICD-10-CM

## 2011-08-31 DIAGNOSIS — S01309A Unspecified open wound of unspecified ear, initial encounter: Secondary | ICD-10-CM | POA: Insufficient documentation

## 2011-08-31 DIAGNOSIS — J4489 Other specified chronic obstructive pulmonary disease: Secondary | ICD-10-CM | POA: Insufficient documentation

## 2011-08-31 DIAGNOSIS — Z8546 Personal history of malignant neoplasm of prostate: Secondary | ICD-10-CM | POA: Insufficient documentation

## 2011-08-31 DIAGNOSIS — I10 Essential (primary) hypertension: Secondary | ICD-10-CM | POA: Insufficient documentation

## 2011-08-31 DIAGNOSIS — J449 Chronic obstructive pulmonary disease, unspecified: Secondary | ICD-10-CM | POA: Insufficient documentation

## 2011-08-31 DIAGNOSIS — Z7982 Long term (current) use of aspirin: Secondary | ICD-10-CM | POA: Insufficient documentation

## 2011-08-31 DIAGNOSIS — G319 Degenerative disease of nervous system, unspecified: Secondary | ICD-10-CM | POA: Insufficient documentation

## 2011-08-31 DIAGNOSIS — IMO0002 Reserved for concepts with insufficient information to code with codable children: Secondary | ICD-10-CM | POA: Insufficient documentation

## 2011-08-31 DIAGNOSIS — W010XXA Fall on same level from slipping, tripping and stumbling without subsequent striking against object, initial encounter: Secondary | ICD-10-CM | POA: Insufficient documentation

## 2011-08-31 DIAGNOSIS — Z79899 Other long term (current) drug therapy: Secondary | ICD-10-CM | POA: Insufficient documentation

## 2011-08-31 NOTE — ED Notes (Addendum)
Pt states "my foot went to sleep, I tried to get up and it just gave out on me and I hit my ear on the garbage can"; pt presents with small laceration to left ear, oozing blood; pt denies LOC.

## 2011-08-31 NOTE — ED Provider Notes (Signed)
History     CSN: [id number]  Arrival date & time 08/31/11  1625   First MD Initiated Contact with Patient 08/31/11 1650      Chief Complaint  Patient presents with  . Head Laceration    (Consider location/radiation/quality/duration/timing/severity/associated sxs/prior treatment) HPI The patient presents to the ER for a fall that occurred just prior to arrival. The patient states that he had been laying bed when the phone rang and he got up to answer it and his L foot was asleep and he stumbled and fell. The patient denies LOC, headache, weakness, N/V, visual changes, CP, SOB, neck pain, hip pain or dizziness. The patient does have a very small cut to his L ear lobe. The patient has an abrasion to his L knee.  Past Medical History  Diagnosis Date  . ANEMIA, B12 DEFICIENCY 12/06/2006  . INSOMNIA, CHRONIC 05/22/2007  . PERIPHERAL AUTONOMIC NEUROPATHY D/O CLASS ELSW 10/27/2008  . HYPERTENSION 11/26/2006  . COPD 12/06/2006  . Slow transit constipation 05/22/2007  . OSTEOARTHROSIS, LOCAL NOS, LOWER LEG 12/12/2006  . Osteoarth NOS-Unspec 12/06/2006  . EDEMA LEG 09/28/2009  . MASS, LUNG 10/07/2009  . NON-HODGKIN'S LYMPHOMA 09/04/2007  . Prostate ca   . Non hodgkins lymphoma     Past Surgical History  Procedure Date  . Hemorrhoid surgery   .  skin cancers   . Cataract extraction, bilateral     Family History  Problem Relation Age of Onset  . Pneumonia Mother   . Cancer Father     History  Substance Use Topics  . Smoking status: Former Smoker    Quit date: 05/01/1950  . Smokeless tobacco: Never Used  . Alcohol Use: No      Review of Systems All other systems negative except as documented in the HPI. All pertinent positives and negatives as reviewed in the HPI.  Allergies  Review of patient's allergies indicates no known allergies.  Home Medications   Current Outpatient Rx  Name Route Sig Dispense Refill  . ACETAMINOPHEN 500 MG PO TABS Oral Take 1,000 mg by mouth every 6 (six)  hours as needed. For pain.     . ASPIRIN 325 MG PO TABS Oral Take 1 tablet (325 mg total) by mouth daily.    Marland Kitchen FOLBEE PO Oral Take 1 tablet by mouth daily.     . FUROSEMIDE 20 MG PO TABS Oral Take 20 mg by mouth daily.      Marland Kitchen GABAPENTIN 300 MG PO CAPS Oral Take 300 mg by mouth 3 (three) times daily.      . GUAIFENESIN ER 600 MG PO TB12 Oral Take 600 mg by mouth 2 (two) times daily as needed. Cough and congestion    . LEUPROLIDE ACETATE (6 MONTH) 45 MG IM KIT Intramuscular Inject 45 mg into the muscle every 6 (six) months.    Marland Kitchen MAGNESIUM HYDROXIDE 400 MG/5ML PO SUSP Oral Take 30 mLs by mouth daily as needed. constipation    . MELATONIN 3 MG PO CAPS Oral Take 1 capsule (3 mg total) by mouth at bedtime. 30 capsule 6  . METOPROLOL TARTRATE 25 MG PO TABS Oral Take 12.5 mg by mouth 2 (two) times daily.      . DISPOSABLE ENEMA 19-7 GM/118ML RE ENEM Rectal Place 1 enema rectally once. follow package directions    . TAMSULOSIN HCL 0.4 MG PO CAPS Oral Take 1 capsule (0.4 mg total) by mouth daily. 30 capsule 6  . TEMAZEPAM 30 MG PO CAPS Oral  Take 1 capsule (30 mg total) by mouth at bedtime as needed. For insomnia. 30 capsule 3    BP 171/63  Pulse 57  Temp(Src) 97.5 F (36.4 C) (Oral)  Resp 20  Wt 195 lb (88.451 kg)  SpO2 99%  Physical Exam Physical Examination: General appearance - alert, well appearing, and in no distress, oriented to person, place, and time and normal appearing weight Mental status - alert, oriented to person, place, and time, normal mood, behavior, speech, dress, motor activity, and thought processes Eyes - pupils equal and reactive, extraocular eye movements intact Ears - bilateral TM's and external ear canals normal. There is a small superficial laceration to the L ear lobe. Nose - normal and patent, no erythema, discharge or polyps Mouth - mucous membranes moist, pharynx normal without lesions Neck - supple, no significant adenopathy Chest - clear to auscultation, no  wheezes, rales or rhonchi, symmetric air entry, no tachypnea, retractions or cyanosis Heart - normal rate, regular rhythm, normal S1, S2, no murmurs, rubs, clicks or gallops Back exam - full range of motion, no tenderness, palpable spasm or pain on motion, No pain on ROM of the neck. There is no bony deformity noted.  Neurological - alert, oriented, normal speech, no focal findings or movement disorder noted, DTR's normal and symmetric, motor and sensory grossly normal bilaterally, normal muscle tone, no tremors, strength 5/5 Musculoskeletal - Hips and pelvis are stable. The patient has an abrasion to the L knee.    ED Course  Procedures (including critical care time)  Labs Reviewed - No data to display Ct Head Wo Contrast  08/31/2011  *RADIOLOGY REPORT*  Clinical Data:  Post fall, now with small laceration to the left ear  CT HEAD WITHOUT CONTRAST CT CERVICAL SPINE WITHOUT CONTRAST  Technique:  Multidetector CT imaging of the head and cervical spine was performed following the standard protocol without intravenous contrast.  Multiplanar CT image reconstructions of the cervical spine were also generated.  Comparison:  Head CT - 12/27/2010  CT HEAD  Findings: There is age appropriate mild diffuse cerebral and cerebellar atrophy with corresponding mild ex vacuo dilatation of the ventricular system. Very minimal amount of scattered periventricular hypodensities compatible with microvascular ischemic disease.  Gray white differentiation is otherwise well maintained.  No CT evidence of acute large territory infarct.  No intraparenchymal or extra-axial mass or hemorrhage.  Unchanged size and configuration of the ventricles and basilar cisterns.  No midline shift.  The paranasal sinuses and mastoid air cells are normal.  Regional soft tissues are normal.  Post bilateral cataract surgery.  No displaced calvarial fracture.  IMPRESSION: Age appropriate atrophy and mild microvascular ischemic disease without acute  intracranial process.  CT CERVICAL SPINE  Findings:  C1 to the superior endplate of T1 is imaged.  Normal alignment of the cervical spine.  No anterolisthesis or retrolisthesis. Degenerative change of the atlantodental articulation.  The dens is normally positioned between the lateral masses of C1.  Normal atlantoaxial articulation.  There is partial calcification of the transverse ligament.  No cervical spine fracture.  Vertebral body heights are preserved. Prevertebral soft tissues are normal.  There is partial ossification of the nuchal ligament.  There is multilevel moderate DDD, worse at C4 - C5, C5 - C6 and C6 - C7 with disc space height loss, end plate irregularity and both anterior and posterior disc osteophyte complexes with likely mild spinal stenosis at these levels.  Regional soft tissues are normal.  IMPRESSION:  1.  No fracture or static subluxation of the cervical spine. 2.  Multilevel moderate DDD, worse at C4 - C5, C5-C6 and C6-C7 with posterior disc osteophyte complexes resulting in multilevel spinal stenosis.  Original Report Authenticated By: Waynard Reeds, M.D.   Ct Cervical Spine Wo Contrast  08/31/2011  *RADIOLOGY REPORT*  Clinical Data:  Post fall, now with small laceration to the left ear  CT HEAD WITHOUT CONTRAST CT CERVICAL SPINE WITHOUT CONTRAST  Technique:  Multidetector CT imaging of the head and cervical spine was performed following the standard protocol without intravenous contrast.  Multiplanar CT image reconstructions of the cervical spine were also generated.  Comparison:  Head CT - 12/27/2010  CT HEAD  Findings: There is age appropriate mild diffuse cerebral and cerebellar atrophy with corresponding mild ex vacuo dilatation of the ventricular system. Very minimal amount of scattered periventricular hypodensities compatible with microvascular ischemic disease.  Gray white differentiation is otherwise well maintained.  No CT evidence of acute large territory infarct.  No  intraparenchymal or extra-axial mass or hemorrhage.  Unchanged size and configuration of the ventricles and basilar cisterns.  No midline shift.  The paranasal sinuses and mastoid air cells are normal.  Regional soft tissues are normal.  Post bilateral cataract surgery.  No displaced calvarial fracture.  IMPRESSION: Age appropriate atrophy and mild microvascular ischemic disease without acute intracranial process.  CT CERVICAL SPINE  Findings:  C1 to the superior endplate of T1 is imaged.  Normal alignment of the cervical spine.  No anterolisthesis or retrolisthesis. Degenerative change of the atlantodental articulation.  The dens is normally positioned between the lateral masses of C1.  Normal atlantoaxial articulation.  There is partial calcification of the transverse ligament.  No cervical spine fracture.  Vertebral body heights are preserved. Prevertebral soft tissues are normal.  There is partial ossification of the nuchal ligament.  There is multilevel moderate DDD, worse at C4 - C5, C5 - C6 and C6 - C7 with disc space height loss, end plate irregularity and both anterior and posterior disc osteophyte complexes with likely mild spinal stenosis at these levels.  Regional soft tissues are normal.  IMPRESSION:  1.  No fracture or static subluxation of the cervical spine. 2.  Multilevel moderate DDD, worse at C4 - C5, C5-C6 and C6-C7 with posterior disc osteophyte complexes resulting in multilevel spinal stenosis.  Original Report Authenticated By: Waynard Reeds, M.D.   Dg Knee Complete 4 Views Left  08/31/2011  *RADIOLOGY REPORT*  Clinical Data: Diffuse left knee pain post fall  LEFT KNEE - COMPLETE 4+ VIEW  Comparison: None  Findings: Osseous demineralization. Osteoarthritic changes with joint space narrowing and marginal spur formation, greatest at medial compartment. Small patellar spur at quadriceps tendon insertion. Chondrocalcinosis question CPPD/pseudogout. No acute fracture, dislocation, or bone  destruction. No knee joint effusion.  IMPRESSION: Osseous demineralization with osteoarthritic changes left knee and question CPPD/pseudogout. No acute abnormalities.  Original Report Authenticated By: Lollie Marrow, M.D.     Patient will be sent back to the assisted living and told to recheck with his PCP. Told to return here as needed. The wounds did not need repair. This gentleman is alert and oriented x3.    MDM  MDM Reviewed: vitals and nursing note Interpretation: CT scan and x-ray            Carlyle Dolly, PA-C 08/31/11 1920

## 2011-08-31 NOTE — Discharge Instructions (Signed)
Return here as needed. Follow up with your doctor for a recheck. °

## 2011-08-31 NOTE — ED Notes (Signed)
WUJ:[id number]<YN> Expected date:<BR> Expected time: 4:17 PM<BR> Means of arrival:<BR> Comments:<BR> M31 - 89yoM fall

## 2011-08-31 NOTE — ED Notes (Signed)
Patient transported to CT 

## 2011-09-01 NOTE — ED Provider Notes (Signed)
Medical screening examination/treatment/procedure(s) were performed by non-physician practitioner and as supervising physician I was immediately available for consultation/collaboration.  Kalisa Girtman R. Zarian Colpitts, MD 09/01/11 0005 

## 2011-09-04 ENCOUNTER — Ambulatory Visit (HOSPITAL_BASED_OUTPATIENT_CLINIC_OR_DEPARTMENT_OTHER): Payer: Medicare Other | Admitting: Nurse Practitioner

## 2011-09-04 ENCOUNTER — Telehealth: Payer: Self-pay | Admitting: Oncology

## 2011-09-04 VITALS — BP 174/82 | HR 82 | Temp 97.3°F | Ht 67.0 in | Wt 198.5 lb

## 2011-09-04 DIAGNOSIS — C8583 Other specified types of non-Hodgkin lymphoma, intra-abdominal lymph nodes: Secondary | ICD-10-CM

## 2011-09-04 DIAGNOSIS — G5690 Unspecified mononeuropathy of unspecified upper limb: Secondary | ICD-10-CM

## 2011-09-04 DIAGNOSIS — C8589 Other specified types of non-Hodgkin lymphoma, extranodal and solid organ sites: Secondary | ICD-10-CM

## 2011-09-04 DIAGNOSIS — Z8546 Personal history of malignant neoplasm of prostate: Secondary | ICD-10-CM

## 2011-09-04 NOTE — Progress Notes (Signed)
OFFICE PROGRESS NOTE  Interval history:  Antonio Bradley returns as scheduled. He overall feels well. He reports having the "flu" since his last visit 6 months ago. He was recently evaluated in the emergency department after a fall. He sustained abrasions to the left knee and left ear. He is residing in an assisted living facility. No fevers or sweats. He has a good appetite. Good energy level. He denies pain at the left hip and pelvis. Neuropathy symptoms are stable.   Objective: Blood pressure 174/82, pulse 82, temperature 97.3 F (36.3 C), temperature source Oral, height 5\' 7"  (1.702 m), weight 198 lb 8 oz (90.039 kg).  Oropharynx is without thrush or ulceration. No palpable cervical, supraclavicular, axillary or inguinal lymph nodes. Prominent bilateral axillary fat pads. Lungs clear. No wheezes or rales. Regular cardiac rhythm. Abdomen soft and nontender. No organomegaly. Extremities without edema.  Lab Results: Lab Results  Component Value Date   WBC 8.5 07/10/2011   HGB 12.4* 07/10/2011   HCT 35.5* 07/10/2011   MCV 92.2 07/10/2011   PLT 257 07/10/2011    Chemistry:    Chemistry      Component Value Date/Time   NA 132* 07/10/2011 1310   K 4.5 07/10/2011 1310   CL 101 07/10/2011 1310   CO2 24 07/10/2011 1310   BUN 19 07/10/2011 1310   CREATININE 1.52* 07/10/2011 1310      Component Value Date/Time   CALCIUM 9.0 07/10/2011 1310   ALKPHOS 74 01/08/2011 2234   AST 23 01/08/2011 2234   ALT 20 01/08/2011 2234   BILITOT 0.2* 01/08/2011 2234       Studies/Results: Ct Head Wo Contrast  08/31/2011  *RADIOLOGY REPORT*  Clinical Data:  Post fall, now with small laceration to the left ear  CT HEAD WITHOUT CONTRAST CT CERVICAL SPINE WITHOUT CONTRAST  Technique:  Multidetector CT imaging of the head and cervical spine was performed following the standard protocol without intravenous contrast.  Multiplanar CT image reconstructions of the cervical spine were also generated.  Comparison:  Head CT - 12/27/2010   CT HEAD  Findings: There is age appropriate mild diffuse cerebral and cerebellar atrophy with corresponding mild ex vacuo dilatation of the ventricular system. Very minimal amount of scattered periventricular hypodensities compatible with microvascular ischemic disease.  Gray white differentiation is otherwise well maintained.  No CT evidence of acute large territory infarct.  No intraparenchymal or extra-axial mass or hemorrhage.  Unchanged size and configuration of the ventricles and basilar cisterns.  No midline shift.  The paranasal sinuses and mastoid air cells are normal.  Regional soft tissues are normal.  Post bilateral cataract surgery.  No displaced calvarial fracture.  IMPRESSION: Age appropriate atrophy and mild microvascular ischemic disease without acute intracranial process.  CT CERVICAL SPINE  Findings:  C1 to the superior endplate of T1 is imaged.  Normal alignment of the cervical spine.  No anterolisthesis or retrolisthesis. Degenerative change of the atlantodental articulation.  The dens is normally positioned between the lateral masses of C1.  Normal atlantoaxial articulation.  There is partial calcification of the transverse ligament.  No cervical spine fracture.  Vertebral body heights are preserved. Prevertebral soft tissues are normal.  There is partial ossification of the nuchal ligament.  There is multilevel moderate DDD, worse at C4 - C5, C5 - C6 and C6 - C7 with disc space height loss, end plate irregularity and both anterior and posterior disc osteophyte complexes with likely mild spinal stenosis at these levels.  Regional soft  tissues are normal.  IMPRESSION:  1.  No fracture or static subluxation of the cervical spine. 2.  Multilevel moderate DDD, worse at C4 - C5, C5-C6 and C6-C7 with posterior disc osteophyte complexes resulting in multilevel spinal stenosis.  Original Report Authenticated By: Waynard Reeds, M.D.   Ct Cervical Spine Wo Contrast  08/31/2011  *RADIOLOGY REPORT*   Clinical Data:  Post fall, now with small laceration to the left ear  CT HEAD WITHOUT CONTRAST CT CERVICAL SPINE WITHOUT CONTRAST  Technique:  Multidetector CT imaging of the head and cervical spine was performed following the standard protocol without intravenous contrast.  Multiplanar CT image reconstructions of the cervical spine were also generated.  Comparison:  Head CT - 12/27/2010  CT HEAD  Findings: There is age appropriate mild diffuse cerebral and cerebellar atrophy with corresponding mild ex vacuo dilatation of the ventricular system. Very minimal amount of scattered periventricular hypodensities compatible with microvascular ischemic disease.  Gray white differentiation is otherwise well maintained.  No CT evidence of acute large territory infarct.  No intraparenchymal or extra-axial mass or hemorrhage.  Unchanged size and configuration of the ventricles and basilar cisterns.  No midline shift.  The paranasal sinuses and mastoid air cells are normal.  Regional soft tissues are normal.  Post bilateral cataract surgery.  No displaced calvarial fracture.  IMPRESSION: Age appropriate atrophy and mild microvascular ischemic disease without acute intracranial process.  CT CERVICAL SPINE  Findings:  C1 to the superior endplate of T1 is imaged.  Normal alignment of the cervical spine.  No anterolisthesis or retrolisthesis. Degenerative change of the atlantodental articulation.  The dens is normally positioned between the lateral masses of C1.  Normal atlantoaxial articulation.  There is partial calcification of the transverse ligament.  No cervical spine fracture.  Vertebral body heights are preserved. Prevertebral soft tissues are normal.  There is partial ossification of the nuchal ligament.  There is multilevel moderate DDD, worse at C4 - C5, C5 - C6 and C6 - C7 with disc space height loss, end plate irregularity and both anterior and posterior disc osteophyte complexes with likely mild spinal stenosis at  these levels.  Regional soft tissues are normal.  IMPRESSION:  1.  No fracture or static subluxation of the cervical spine. 2.  Multilevel moderate DDD, worse at C4 - C5, C5-C6 and C6-C7 with posterior disc osteophyte complexes resulting in multilevel spinal stenosis.  Original Report Authenticated By: Waynard Reeds, M.D.   Dg Knee Complete 4 Views Left  08/31/2011  *RADIOLOGY REPORT*  Clinical Data: Diffuse left knee pain post fall  LEFT KNEE - COMPLETE 4+ VIEW  Comparison: None  Findings: Osseous demineralization. Osteoarthritic changes with joint space narrowing and marginal spur formation, greatest at medial compartment. Small patellar spur at quadriceps tendon insertion. Chondrocalcinosis question CPPD/pseudogout. No acute fracture, dislocation, or bone destruction. No knee joint effusion.  IMPRESSION: Osseous demineralization with osteoarthritic changes left knee and question CPPD/pseudogout. No acute abnormalities.  Original Report Authenticated By: Lollie Marrow, M.D.    Medications: I have reviewed the patient's current medications.  Assessment/Plan:  1. Non-Hodgkin's lymphoma involving a retroperitoneal mass and retroperitoneal/retrocrural adenopathy on a CT 05/31/2007. He completed 4 cycles of CVP/Rituxan with the last chemotherapy given 08/22/2007. 2. Left abdomen, flank and hip pain. Improved following chemotherapy.  3. Chronic bilateral knee and back pain. 4. History of irregular heart rate. 5. Numbness/tingling in the fingertips with associated pain. A vitamin B12 level was normal in January 2010.  A repeat vitamin B12 level 09/23/2009 was normal. He is maintained on Neurontin. Neuropathy symptoms are stable. 6. History of prostate cancer followed by Dr. Isabel Caprice. 7. Chest x-ray 09/28/2009 with an oval opacity projecting over the lower left mid lung zone on the PA image measuring 3.5 x 4.5 cm and on the lateral image an oval opacity projected over the major fissure in the hilar region.  Review of the chest x-rays and a PET/CT scan from 06/17/2007 found the density on chest x-ray to be present on the CT from 06/17/2007 and not significantly changed in size. Repeat chest x-ray on 07/25/2010 was unchanged.  Disposition-Antonio Bradley appears stable. He remains in clinical remission from the non-Hodgkin's lymphoma. He will return for a followup visit in 6 months. He will contact the office in the interim with any problems.  Plan reviewed with Dr. Truett Perna.  Lonna Cobb ANP/GNP-BC

## 2011-09-04 NOTE — Telephone Encounter (Signed)
appts  Made and printed for pt aom °

## 2011-12-29 ENCOUNTER — Other Ambulatory Visit: Payer: Self-pay

## 2011-12-29 DIAGNOSIS — L97909 Non-pressure chronic ulcer of unspecified part of unspecified lower leg with unspecified severity: Secondary | ICD-10-CM

## 2012-01-15 ENCOUNTER — Encounter: Payer: Self-pay | Admitting: Vascular Surgery

## 2012-01-16 ENCOUNTER — Ambulatory Visit (INDEPENDENT_AMBULATORY_CARE_PROVIDER_SITE_OTHER): Payer: Medicare Other | Admitting: Vascular Surgery

## 2012-01-16 ENCOUNTER — Encounter: Payer: Self-pay | Admitting: Vascular Surgery

## 2012-01-16 VITALS — BP 167/70 | HR 47 | Resp 16 | Ht 65.0 in | Wt 199.2 lb

## 2012-01-16 DIAGNOSIS — I83229 Varicose veins of left lower extremity with both ulcer of unspecified site and inflammation: Secondary | ICD-10-CM

## 2012-01-16 DIAGNOSIS — I83893 Varicose veins of bilateral lower extremities with other complications: Secondary | ICD-10-CM

## 2012-01-16 DIAGNOSIS — I83219 Varicose veins of right lower extremity with both ulcer of unspecified site and inflammation: Secondary | ICD-10-CM | POA: Insufficient documentation

## 2012-01-16 DIAGNOSIS — L97919 Non-pressure chronic ulcer of unspecified part of right lower leg with unspecified severity: Secondary | ICD-10-CM | POA: Insufficient documentation

## 2012-01-16 DIAGNOSIS — L97909 Non-pressure chronic ulcer of unspecified part of unspecified lower leg with unspecified severity: Secondary | ICD-10-CM

## 2012-01-16 NOTE — Progress Notes (Signed)
Bilateral lower extremity venous reflux performed @ [facility] 01/16/2012

## 2012-01-16 NOTE — Progress Notes (Signed)
[facility]   Patient name: Antonio Bradley MRN: [id number] DOB: [birth year] Sex: male   Referred by: Nehemiah Settle  Reason for referral:  Chief Complaint  Patient presents with  . New Evaluation    leg ulcers right pretibial area,  c/o stinging pain     HISTORY OF PRESENT ILLNESS: Patient is a 78 year old gentleman who presents for evaluation of pretibial ulceration of the right leg. Despite his advanced age he is alert mentally and does walk with a walker. He has had history of bilateral pretibial ulcerations in the past. The left side is mostly healed with chronic chronic scarring. On the right side he does have some superficial ulcerations. He does have a history of chronic edema. No history of DVT.  Past Medical History  Diagnosis Date  . ANEMIA, B12 DEFICIENCY 12/06/2006  . INSOMNIA, CHRONIC 05/22/2007  . PERIPHERAL AUTONOMIC NEUROPATHY D/O CLASS ELSW 10/27/2008  . HYPERTENSION 11/26/2006  . COPD 12/06/2006  . Slow transit constipation 05/22/2007  . OSTEOARTHROSIS, LOCAL NOS, LOWER LEG 12/12/2006  . Osteoarth NOS-Unspec 12/06/2006  . EDEMA LEG 09/28/2009  . MASS, LUNG 10/07/2009  . NON-HODGKIN'S LYMPHOMA 09/04/2007  . Prostate ca   . Non hodgkins lymphoma     Past Surgical History  Procedure Date  . Hemorrhoid surgery   .  skin cancers   . Cataract extraction, bilateral     History   Social History  . Marital Status: Married    Spouse Name: N/A    Number of Children: N/A  . Years of Education: N/A   Occupational History  . Not on file.   Social History Main Topics  . Smoking status: Former Smoker    Quit date: 05/01/1950  . Smokeless tobacco: Never Used  . Alcohol Use: No  . Drug Use: No  . Sexually Active: No   Other Topics Concern  . Not on file   Social History Narrative  . No narrative on file    Family History  Problem Relation Age of Onset  . Pneumonia Mother   . Cancer Father     Allergies as of 01/16/2012  . (No Known  Allergies)    Current Outpatient Prescriptions on File Prior to Visit  Medication Sig Dispense Refill  . acetaminophen (TYLENOL) 500 MG tablet Take 1,000 mg by mouth every 6 (six) hours as needed. For pain.       Marland Kitchen aspirin 325 MG tablet Take 1 tablet (325 mg total) by mouth daily.      . Folic Acid-Vit B6-Vit B12 (FOLBEE PO) Take 1 tablet by mouth daily.       . furosemide (LASIX) 20 MG tablet Take 20 mg by mouth daily.        Marland Kitchen gabapentin (NEURONTIN) 300 MG capsule Take 300 mg by mouth 3 (three) times daily.       Marland Kitchen guaiFENesin (MUCINEX) 600 MG 12 hr tablet Take 600 mg by mouth 2 (two) times daily as needed. Cough and congestion      . Leuprolide Acetate, 6 Month, (LUPRON DEPOT) 45 MG injection Inject 45 mg into the muscle every 6 (six) months.      . magnesium hydroxide (MILK OF MAGNESIA) 400 MG/5ML suspension Take 30 mLs by mouth daily as needed. constipation      . Melatonin 3 MG CAPS Take 1 capsule (3 mg total) by mouth at bedtime.  30 capsule  6  . metoprolol tartrate (LOPRESSOR) 25 MG tablet Take 12.5 mg by  mouth 2 (two) times daily.        . sodium phosphate (FLEET) enema Place 1 enema rectally once. follow package directions      . Tamsulosin HCl (FLOMAX) 0.4 MG CAPS Take 1 capsule (0.4 mg total) by mouth daily.  30 capsule  6  . zolpidem (AMBIEN) 5 MG tablet Take 5 mg by mouth at bedtime as needed.      . temazepam (RESTORIL) 30 MG capsule Take 1 capsule (30 mg total) by mouth at bedtime as needed. For insomnia.  30 capsule  3     REVIEW OF SYSTEMS:  Positives indicated with an "X"  CARDIOVASCULAR:  [ ]  chest pain   [ ]  chest pressure   [ ]  palpitations   [ ]  orthopnea   [ ]  dyspnea on exertion   [ ]  claudication   [ ]  rest pain   [ ]  DVT   [ ]  phlebitis PULMONARY:   [ ]  productive cough   [ ]  asthma   [ ]  wheezing NEUROLOGIC:   [ ]  weakness  [ ]  paresthesias  [ ]  aphasia  [ ]  amaurosis  [ ]  dizziness HEMATOLOGIC:   [ ]  bleeding problems   [ ]  clotting  disorders MUSCULOSKELETAL:  [ ]  joint pain   [ ]  joint swelling GASTROINTESTINAL: [ ]   blood in stool  [ ]   hematemesis GENITOURINARY:  [ ]   dysuria  [ ]   hematuria PSYCHIATRIC:  [ ]  history of major depression INTEGUMENTARY:  [ ]  rashes  [ ]  ulcers CONSTITUTIONAL:  [ ]  fever   [ ]  chills  PHYSICAL EXAMINATION:  General: The patient is a well-nourished male, in no acute distress. Vital signs are BP 167/70  Pulse 47  Resp 16  Ht 5\' 5"  (1.651 m)  Wt 199 lb 3.2 oz (90.357 kg)  BMI 33.15 kg/m2 Pulmonary: There is a good air exchange bilaterally without wheezing or rales. Abdomen: Soft and non-tender with normal pitch bowel sounds. Musculoskeletal: There are no major deformities.  There is no significant extremity pain. Neurologic: No focal weakness or paresthesias are detected, Skin: Does have changes of chronic venous hypertension bilaterally over the pretibial areas. The hemosiderin deposits R. Menin anterior half of his legs from his knees distally. He does not have any open ulcerations on the left. He does have superficial ulcerations on the right pretibial area. There is noted surrounding erythema. Psychiatric: The patient has normal affect. Cardiovascular: There is a regular rate and rhythm without significant murmur appreciated. Does have a 2+ dorsalis pedis pulse on the right absent pulse on the left  [facility] Lab Studies:  Ordered and Independently Reviewed venous duplex showed no evidence of saphenous vein incompetence. There is no evidence of DVT. He does have reflux in his deep system bilaterally.  Impression and Plan:  Chronic venous hypertension with open superficial ulcerations on the right leg. I discussed this with the patient and his daughter present. I explained that he does have normal arterial flow on the right with 2+ dorsalis pedis pulse. I explained critical importance of compression in addition to local wound care. He is being seen by the home health nurse. We  have initiated and Silvadene treatment to the open ulcerations and Ace wrap from his foot up to the below knee position and a structure that these should be in place whenever he is up walking or sitting. He will continue this treatment at a skilled nursing facility and see [place] on an as-needed  basis    Tauren Delbuono [facility] [place] Office: [phone]

## 2012-01-30 NOTE — Progress Notes (Signed)
  Subjective:    Patient ID: Antonio Bradley, male    DOB: [birth year], 78 y.o.   MRN: [id number]  HPI  Opened in error  Review of Systems     Objective:   Physical Exam        Assessment & Plan:

## 2012-03-05 ENCOUNTER — Other Ambulatory Visit: Payer: Medicare Other | Admitting: Lab

## 2012-03-05 ENCOUNTER — Telehealth: Payer: Self-pay | Admitting: Oncology

## 2012-03-05 ENCOUNTER — Ambulatory Visit (HOSPITAL_BASED_OUTPATIENT_CLINIC_OR_DEPARTMENT_OTHER): Payer: Medicare Other | Admitting: Oncology

## 2012-03-05 VITALS — BP 157/68 | HR 85 | Temp 96.7°F | Resp 18 | Ht 65.0 in | Wt 199.7 lb

## 2012-03-05 DIAGNOSIS — Z23 Encounter for immunization: Secondary | ICD-10-CM

## 2012-03-05 DIAGNOSIS — C8589 Other specified types of non-Hodgkin lymphoma, extranodal and solid organ sites: Secondary | ICD-10-CM

## 2012-03-05 DIAGNOSIS — M25569 Pain in unspecified knee: Secondary | ICD-10-CM

## 2012-03-05 MED ORDER — INFLUENZA VIRUS VACC SPLIT PF IM SUSP
0.5000 mL | Freq: Once | INTRAMUSCULAR | Status: AC
  Administered 2012-03-05: 0.5 mL via INTRAMUSCULAR
  Filled 2012-03-05: qty 0.5

## 2012-03-05 NOTE — Telephone Encounter (Signed)
appts made and printed for pt  °

## 2012-03-05 NOTE — Progress Notes (Signed)
   [facility]    OFFICE PROGRESS NOTE   INTERVAL HISTORY:   He returns as scheduled. He reports intermittent discomfort in the left lateral abdominal wall/flank region. No consistent pain. No other complaint. He is active with exercise. He was evaluated by Dr. Arbie Cookey for stasis changes at the right lower leg and he continues to receive treatment from a nurse at the assisted living facility.  Objective:  Vital signs in last 24 hours:  Blood pressure 157/68, pulse 85, temperature 96.7 F (35.9 C), temperature source Oral, resp. rate 18, height 5\' 5"  (1.651 m), weight 199 lb 11.2 oz (90.583 kg).    HEENT: Neck without mass Lymphatics: No cervical, supraclavicular, axillary, or inguinal nodes Resp: Inspiratory rales at the left greater than right base, no respiratory distress Cardio: Regular rate and rhythm, 2/6 systolic murmur GI: No hepatomegaly, no mass, nontender Vascular: No leg edema, the right lower leg is cover with an Ace wrap     Medications: I have reviewed the patient's current medications.  Assessment/Plan: 1-non-Hodgkin's lymphoma involving a retroperitoneal mass and retroperitoneal/retrocrural adenopathy on a CT 05/31/2007. Status post 4 cycles of CVP/Rituxan with the last chemotherapy given 08/22/2007.  #2-left abdomen, flank, hip pain-improved following chemotherapy  #3-chronic bilateral knee and back pain  #4-history of an irregular heart rate  #5-numbness/tingling in the fingertips with associated pain-a vitamin B 12 level was normal in January 2010. A repeat vitamin B 12 level 09/23/2009 was normal. He is now maintained on Neurontin.  #6-history of prostate cancer followed by Dr. Isabel Caprice  #7-chest x-ray 09/28/2009 with an oval opacity projecting over the lower left midlung zone on the PA image measuring 3.5 x 4.5 cm and on the lateral image an oval opacity projected over the major fissure in the hilar region. Review of the chest x-rays and a PET/CT  scan from 06/16/1998 9000 the density of the chest x-ray to be present on the CT from 06/17/2007 and not significantly changed in size.   Disposition:  Antonio Bradley remains in remission from the non-Hodgkin's lymphoma. He received an influenza vaccine today. He will return for an office visit in 6 months.   Thornton Papas, MD  03/05/2012  11:55 AM

## 2012-04-04 ENCOUNTER — Inpatient Hospital Stay (HOSPITAL_COMMUNITY)
Admission: EM | Admit: 2012-04-04 | Discharge: 2012-04-06 | DRG: 194 | Disposition: A | Discharge status: Home Health | Payer: Medicare Other | Attending: Internal Medicine | Admitting: Internal Medicine

## 2012-04-04 ENCOUNTER — Encounter (HOSPITAL_COMMUNITY): Payer: Self-pay | Admitting: *Deleted

## 2012-04-04 ENCOUNTER — Emergency Department (HOSPITAL_COMMUNITY): Payer: Medicare Other

## 2012-04-04 DIAGNOSIS — G909 Disorder of the autonomic nervous system, unspecified: Secondary | ICD-10-CM | POA: Diagnosis present

## 2012-04-04 DIAGNOSIS — C8589 Other specified types of non-Hodgkin lymphoma, extranodal and solid organ sites: Secondary | ICD-10-CM | POA: Diagnosis present

## 2012-04-04 DIAGNOSIS — M1710 Unilateral primary osteoarthritis, unspecified knee: Secondary | ICD-10-CM | POA: Diagnosis present

## 2012-04-04 DIAGNOSIS — E871 Hypo-osmolality and hyponatremia: Secondary | ICD-10-CM | POA: Diagnosis present

## 2012-04-04 DIAGNOSIS — E669 Obesity, unspecified: Secondary | ICD-10-CM | POA: Diagnosis present

## 2012-04-04 DIAGNOSIS — D649 Anemia, unspecified: Secondary | ICD-10-CM | POA: Diagnosis present

## 2012-04-04 DIAGNOSIS — Z87891 Personal history of nicotine dependence: Secondary | ICD-10-CM

## 2012-04-04 DIAGNOSIS — Z8546 Personal history of malignant neoplasm of prostate: Secondary | ICD-10-CM

## 2012-04-04 DIAGNOSIS — R351 Nocturia: Secondary | ICD-10-CM

## 2012-04-04 DIAGNOSIS — G4700 Insomnia, unspecified: Secondary | ICD-10-CM | POA: Diagnosis present

## 2012-04-04 DIAGNOSIS — K5901 Slow transit constipation: Secondary | ICD-10-CM | POA: Diagnosis present

## 2012-04-04 DIAGNOSIS — Z79899 Other long term (current) drug therapy: Secondary | ICD-10-CM

## 2012-04-04 DIAGNOSIS — Z6831 Body mass index (BMI) 31.0-31.9, adult: Secondary | ICD-10-CM

## 2012-04-04 DIAGNOSIS — A0472 Enterocolitis due to Clostridium difficile, not specified as recurrent: Secondary | ICD-10-CM | POA: Diagnosis present

## 2012-04-04 DIAGNOSIS — J189 Pneumonia, unspecified organism: Secondary | ICD-10-CM | POA: Diagnosis present

## 2012-04-04 DIAGNOSIS — I10 Essential (primary) hypertension: Secondary | ICD-10-CM | POA: Diagnosis present

## 2012-04-04 DIAGNOSIS — E785 Hyperlipidemia, unspecified: Secondary | ICD-10-CM | POA: Diagnosis present

## 2012-04-04 DIAGNOSIS — D518 Other vitamin B12 deficiency anemias: Secondary | ICD-10-CM | POA: Diagnosis present

## 2012-04-04 LAB — POCT I-STAT TROPONIN I: Troponin i, poc: 0 ng/mL (ref 0.00–0.08)

## 2012-04-04 LAB — CBC WITH DIFFERENTIAL/PLATELET
Eosinophils Absolute: 0.1 10*3/uL (ref 0.0–0.7)
Eosinophils Relative: 1 % (ref 0–5)
HCT: 34 % — ABNORMAL LOW (ref 39.0–52.0)
Hemoglobin: 12 g/dL — ABNORMAL LOW (ref 13.0–17.0)
Lymphs Abs: 1.2 10*3/uL (ref 0.7–4.0)
MCH: 31.5 pg (ref 26.0–34.0)
MCV: 89.2 fL (ref 78.0–100.0)
Monocytes Absolute: 1.1 10*3/uL — ABNORMAL HIGH (ref 0.1–1.0)
Monocytes Relative: 9 % (ref 3–12)
RBC: 3.81 MIL/uL — ABNORMAL LOW (ref 4.22–5.81)

## 2012-04-04 LAB — URINALYSIS, ROUTINE W REFLEX MICROSCOPIC
Ketones, ur: NEGATIVE mg/dL
Leukocytes, UA: NEGATIVE
Nitrite: NEGATIVE
Specific Gravity, Urine: 1.013 (ref 1.005–1.030)
Urobilinogen, UA: 0.2 mg/dL (ref 0.0–1.0)
pH: 7 (ref 5.0–8.0)

## 2012-04-04 LAB — COMPREHENSIVE METABOLIC PANEL
BUN: 23 mg/dL (ref 6–23)
Calcium: 9.1 mg/dL (ref 8.4–10.5)
GFR calc Af Amer: 50 mL/min — ABNORMAL LOW (ref >90)
GFR calc non Af Amer: 43 mL/min — ABNORMAL LOW (ref >90)
Glucose, Bld: 125 mg/dL — ABNORMAL HIGH (ref 70–99)
Total Protein: 7 g/dL (ref 6.0–8.3)

## 2012-04-04 LAB — LACTIC ACID, PLASMA: Lactic Acid, Venous: 1.6 mmol/L (ref 0.5–2.2)

## 2012-04-04 LAB — URINE MICROSCOPIC-ADD ON

## 2012-04-04 MED ORDER — LEVOFLOXACIN IN D5W 750 MG/150ML IV SOLN
750.0000 mg | INTRAVENOUS | Status: DC
  Administered 2012-04-04: 750 mg via INTRAVENOUS
  Filled 2012-04-04: qty 150

## 2012-04-04 MED ORDER — VANCOMYCIN HCL IN DEXTROSE 1-5 GM/200ML-% IV SOLN
1000.0000 mg | INTRAVENOUS | Status: DC
  Administered 2012-04-05: 1000 mg via INTRAVENOUS
  Filled 2012-04-04: qty 200

## 2012-04-04 MED ORDER — GABAPENTIN 300 MG PO CAPS
300.0000 mg | ORAL_CAPSULE | Freq: Two times a day (BID) | ORAL | Status: DC
  Administered 2012-04-04 – 2012-04-06 (×5): 300 mg via ORAL
  Filled 2012-04-04 (×6): qty 1

## 2012-04-04 MED ORDER — MAGNESIUM HYDROXIDE 400 MG/5ML PO SUSP: 30.0000 mL | Freq: Every day | ORAL | Status: DC | PRN

## 2012-04-04 MED ORDER — SODIUM CHLORIDE 0.9 % IJ SOLN: 3.0000 mL | Freq: Two times a day (BID) | INTRAMUSCULAR | Status: DC

## 2012-04-04 MED ORDER — METOPROLOL TARTRATE 12.5 MG HALF TABLET
12.5000 mg | ORAL_TABLET | Freq: Two times a day (BID) | ORAL | Status: DC
  Administered 2012-04-04 – 2012-04-06 (×4): 12.5 mg via ORAL
  Filled 2012-04-04 (×6): qty 1

## 2012-04-04 MED ORDER — ACETAMINOPHEN 325 MG PO TABS
ORAL_TABLET | ORAL | Status: AC
  Administered 2012-04-04: 650 mg via ORAL
  Filled 2012-04-04: qty 2

## 2012-04-04 MED ORDER — MELATONIN 3 MG PO CAPS: 3.0000 mg | ORAL_CAPSULE | Freq: Every day | ORAL | Status: DC

## 2012-04-04 MED ORDER — ACETAMINOPHEN 325 MG PO TABS
650.0000 mg | ORAL_TABLET | Freq: Once | ORAL | Status: AC
  Administered 2012-04-04: 650 mg via ORAL

## 2012-04-04 MED ORDER — SODIUM CHLORIDE 0.9 % IV SOLN
INTRAVENOUS | Status: DC
  Administered 2012-04-05 – 2012-04-06 (×2): via INTRAVENOUS

## 2012-04-04 MED ORDER — L-METHYLFOLATE-B6-B12 3-35-2 MG PO TABS
1.0000 | ORAL_TABLET | Freq: Every day | ORAL | Status: DC
  Administered 2012-04-04 – 2012-04-06 (×3): 1 via ORAL
  Filled 2012-04-04 (×3): qty 1

## 2012-04-04 MED ORDER — ALBUTEROL SULFATE (5 MG/ML) 0.5% IN NEBU
2.5000 mg | INHALATION_SOLUTION | Freq: Four times a day (QID) | RESPIRATORY_TRACT | Status: DC
  Administered 2012-04-04 – 2012-04-05 (×3): 2.5 mg via RESPIRATORY_TRACT
  Filled 2012-04-04 (×3): qty 0.5

## 2012-04-04 MED ORDER — SODIUM CHLORIDE 0.9 % IJ SOLN: 3.0000 mL | INTRAMUSCULAR | Status: DC | PRN

## 2012-04-04 MED ORDER — SODIUM CHLORIDE 0.9 % IV SOLN
INTRAVENOUS | Status: AC
  Administered 2012-04-04: 50 mL/h via INTRAVENOUS

## 2012-04-04 MED ORDER — ACETAMINOPHEN 500 MG PO TABS
500.0000 mg | ORAL_TABLET | Freq: Every evening | ORAL | Status: DC | PRN
  Administered 2012-04-04: 500 mg via ORAL
  Filled 2012-04-04: qty 1

## 2012-04-04 MED ORDER — FUROSEMIDE 20 MG PO TABS
20.0000 mg | ORAL_TABLET | Freq: Every day | ORAL | Status: DC
  Administered 2012-04-04 – 2012-04-06 (×3): 20 mg via ORAL
  Filled 2012-04-04 (×3): qty 1

## 2012-04-04 MED ORDER — PIPERACILLIN-TAZOBACTAM 3.375 G IVPB
3.3750 g | Freq: Once | INTRAVENOUS | Status: AC
  Administered 2012-04-04: 3.375 g via INTRAVENOUS
  Filled 2012-04-04: qty 50

## 2012-04-04 MED ORDER — ENSURE IMMUNE HEALTH PO LIQD: 237.0000 mL | Freq: Three times a day (TID) | ORAL | Status: DC

## 2012-04-04 MED ORDER — ENSURE COMPLETE PO LIQD
237.0000 mL | Freq: Three times a day (TID) | ORAL | Status: DC
  Administered 2012-04-04: 237 mL via ORAL

## 2012-04-04 MED ORDER — DIPHENHYDRAMINE-APAP (SLEEP) 25-500 MG PO TABS: 1.0000 | ORAL_TABLET | Freq: Every evening | ORAL | Status: DC | PRN

## 2012-04-04 MED ORDER — FOLIC ACID-VIT B6-VIT B12 2.5-25-1 MG PO TABS: 1.0000 | ORAL_TABLET | Freq: Every day | ORAL | Status: DC

## 2012-04-04 MED ORDER — ENOXAPARIN SODIUM 40 MG/0.4ML ~~LOC~~ SOLN
40.0000 mg | SUBCUTANEOUS | Status: DC
  Administered 2012-04-04 – 2012-04-05 (×2): 40 mg via SUBCUTANEOUS
  Filled 2012-04-04 (×3): qty 0.4

## 2012-04-04 MED ORDER — DIPHENHYDRAMINE HCL 25 MG PO CAPS
25.0000 mg | ORAL_CAPSULE | Freq: Every evening | ORAL | Status: DC | PRN
  Administered 2012-04-04 – 2012-04-05 (×2): 25 mg via ORAL
  Filled 2012-04-04 (×2): qty 1

## 2012-04-04 MED ORDER — TAMSULOSIN HCL 0.4 MG PO CAPS
0.4000 mg | ORAL_CAPSULE | Freq: Every day | ORAL | Status: DC
Start: 2012-04-04 — End: 2012-04-06
  Administered 2012-04-04 – 2012-04-06 (×3): 0.4 mg via ORAL
  Filled 2012-04-04 (×3): qty 1

## 2012-04-04 MED ORDER — ACETAMINOPHEN 500 MG PO TABS: 1000.0000 mg | ORAL_TABLET | Freq: Four times a day (QID) | ORAL | Status: DC | PRN

## 2012-04-04 MED ORDER — PIPERACILLIN-TAZOBACTAM 3.375 G IVPB
3.3750 g | Freq: Once | INTRAVENOUS | Status: DC
  Administered 2012-04-04: 3.375 g via INTRAVENOUS
  Filled 2012-04-04: qty 50

## 2012-04-04 MED ORDER — VANCOMYCIN HCL IN DEXTROSE 1-5 GM/200ML-% IV SOLN
1000.0000 mg | Freq: Once | INTRAVENOUS | Status: AC
  Administered 2012-04-04: 1000 mg via INTRAVENOUS
  Filled 2012-04-04: qty 200

## 2012-04-04 MED ORDER — DEXTROSE 5 % IV SOLN
1.0000 g | INTRAVENOUS | Status: DC
  Administered 2012-04-04: 1 g via INTRAVENOUS
  Filled 2012-04-04 (×2): qty 1

## 2012-04-04 MED ORDER — SODIUM CHLORIDE 0.9 % IV SOLN: 250.0000 mL | INTRAVENOUS | Status: DC | PRN

## 2012-04-04 MED ORDER — SIMVASTATIN 40 MG PO TABS
40.0000 mg | ORAL_TABLET | Freq: Every evening | ORAL | Status: DC
  Administered 2012-04-04 – 2012-04-06 (×3): 40 mg via ORAL
  Filled 2012-04-04 (×3): qty 1

## 2012-04-04 NOTE — ED Notes (Signed)
Attempted to call report x1, RN busy. Mesg left.

## 2012-04-04 NOTE — ED Notes (Signed)
ZOX:[id number]<AV> Expected date:04/04/12<BR> Expected time: 7:19 AM<BR> Means of arrival:Ambulance<BR> Comments:<BR> 78 yo productive cough

## 2012-04-04 NOTE — ED Provider Notes (Signed)
History     CSN: [id number]  Arrival date & time 04/04/12  4098   First MD Initiated Contact with Patient 04/04/12 0802      Chief Complaint  Patient presents with  . Cough  . Generalized Body Aches    (Consider location/radiation/quality/duration/timing/severity/associated sxs/prior treatment) Patient is a 78 y.o. male presenting with cough. The history is provided by the patient.  Cough   patient here with cough fever and congestion since yesterday. Patient was placed on Zithromax without relief. No vomiting or diarrhea. Does note diffuse myalgias without rashes. Denies any urinary symptoms. No chest pain or chest pressure. Cough isn't productive of brown sputum. Does have a prior history of pneumonia in the past. Called EMS and was transported here  Past Medical History  Diagnosis Date  . ANEMIA, B12 DEFICIENCY 12/06/2006  . INSOMNIA, CHRONIC 05/22/2007  . PERIPHERAL AUTONOMIC NEUROPATHY D/O CLASS ELSW 10/27/2008  . HYPERTENSION 11/26/2006  . COPD 12/06/2006  . Slow transit constipation 05/22/2007  . OSTEOARTHROSIS, LOCAL NOS, LOWER LEG 12/12/2006  . Osteoarth NOS-Unspec 12/06/2006  . EDEMA LEG 09/28/2009  . MASS, LUNG 10/07/2009  . NON-HODGKIN'S LYMPHOMA 09/04/2007  . Prostate ca   . Non hodgkins lymphoma     Past Surgical History  Procedure Date  . Hemorrhoid surgery   .  skin cancers   . Cataract extraction, bilateral     Family History  Problem Relation Age of Onset  . Pneumonia Mother   . Cancer Father     History  Substance Use Topics  . Smoking status: Former Smoker    Quit date: 05/01/1950  . Smokeless tobacco: Never Used  . Alcohol Use: No      Review of Systems  Respiratory: Positive for cough.   All other systems reviewed and are negative.    Allergies  Review of patient's allergies indicates no known allergies.  Home Medications   Current Outpatient Rx  Name  Route  Sig  Dispense  Refill  . ACETAMINOPHEN 500 MG PO TABS   Oral   Take 1,000 mg  by mouth every 6 (six) hours as needed. For pain.          Marland Kitchen DIPHENHYDRAMINE-APAP (SLEEP) 25-500 MG PO TABS   Oral   Take 1 tablet by mouth at bedtime as needed.         Marland Kitchen FOLBEE PO   Oral   Take 1 tablet by mouth daily.          . FUROSEMIDE 20 MG PO TABS   Oral   Take 20 mg by mouth daily.           Marland Kitchen GABAPENTIN 300 MG PO CAPS   Oral   Take 300 mg by mouth 3 (three) times daily.          Marland Kitchen LEUPROLIDE ACETATE (6 MONTH) 45 MG IM KIT   Intramuscular   Inject 45 mg into the muscle every 6 (six) months.         Marland Kitchen MAGNESIUM HYDROXIDE 400 MG/5ML PO SUSP   Oral   Take 30 mLs by mouth daily as needed. constipation         . MELATONIN 3 MG PO CAPS   Oral   Take 1 capsule (3 mg total) by mouth at bedtime.   30 capsule   6   . METOPROLOL TARTRATE 25 MG PO TABS   Oral   Take 12.5 mg by mouth 2 (two) times daily.           Marland Kitchen  TAMSULOSIN HCL 0.4 MG PO CAPS   Oral   Take 1 capsule (0.4 mg total) by mouth daily.   30 capsule   6     BP 186/65  Pulse 85  Temp 100.3 F (37.9 C) (Rectal)  Resp 26  SpO2 94%  Physical Exam  Nursing note and vitals reviewed. Constitutional: He is oriented to person, place, and time. He appears well-developed and well-nourished.  Non-toxic appearance. No distress.  HENT:  Head: Normocephalic and atraumatic.  Eyes: Conjunctivae normal, EOM and lids are normal. Pupils are equal, round, and reactive to light.  Neck: Normal range of motion. Neck supple. No tracheal deviation present. No mass present.  Cardiovascular: Normal rate, regular rhythm and normal heart sounds.  Exam reveals no gallop.   No murmur heard. Pulmonary/Chest: Effort normal. No stridor. No respiratory distress. He has decreased breath sounds. He has wheezes. He has no rhonchi. He has no rales.  Abdominal: Soft. Normal appearance and bowel sounds are normal. He exhibits no distension. There is no tenderness. There is no rebound and no CVA tenderness.   Musculoskeletal: Normal range of motion. He exhibits no edema and no tenderness.  Neurological: He is alert and oriented to person, place, and time. He has normal strength. No cranial nerve deficit or sensory deficit. GCS eye subscore is 4. GCS verbal subscore is 5. GCS motor subscore is 6.  Skin: Skin is warm and dry. No abrasion and no rash noted.  Psychiatric: He has a normal mood and affect. His speech is normal and behavior is normal.    ED Course  Procedures (including critical care time)   Labs Reviewed  CBC WITH DIFFERENTIAL  COMPREHENSIVE METABOLIC PANEL  URINALYSIS, ROUTINE W REFLEX MICROSCOPIC  URINE CULTURE  CULTURE, BLOOD (ROUTINE X 2)  CULTURE, BLOOD (ROUTINE X 2)  LACTIC ACID, PLASMA   No results found.   No diagnosis found.    MDM  Pt to be admitted for pna        Toy Baker, MD 04/06/12 [phone]

## 2012-04-04 NOTE — H&P (Addendum)
[facility] History and Physical  Chamberlain Steinborn Wubben WUJ:[id number] DOB: 1912/09/08 DOA: 04/04/2012  Referring physician: Dr. Lorre Nick PCP: Marlowe Aschoff, FNP  And Dr. Darryll Capers Oncologist: Dr. Mancel Bale  Chief Complaint: Cough   History of Present Illness: Antonio Bradley is an 78 y.o. male with a past medical history of non-Hodgkin's lymphoma, in remission, who presented to the hospital with a chief complaint of cough, congestion, body aches, and fever for the past 24-48 hours. He was put on azithromycin at his facility, but despite initiation of antibiotics, his symptoms have worsened. He has received his flu vaccine for the season.  He has had a pneumococcal vaccine as well. Some of the residents at his ALF have been sick with similar symptoms. The patient was noted to have an infiltrate on initial chest radiography and was referred for inpatient treatment.  Review of Systems: Constitutional: + fever, no chills;  Appetite fair; No weight loss, no weight gain, + fatigue  HEENT: No blurry vision, no diplopia, + pharyngitis, no dysphagia CV: No chest pain, no palpitations.  Resp: + SOB and cough. GI: No nausea, no vomiting, no diarrhea, no melena, no hematochezia.  GU: No dysuria, no hematuria.  MSK: + myalgias, no arthralgias.  Neuro:  No headache, no focal neurological deficits, no history of seizures.  Psych: No depression, no anxiety.  Endo: No thyroid disease, no DM, no heat intolerance, no cold intolerance, no polyuria, no polydipsia  Skin: No rashes, no skin lesions.  Heme: No easy bruising, no history of blood diseases.  Past Medical History Past Medical History  Diagnosis Date  . ANEMIA, B12 DEFICIENCY 12/06/2006  . INSOMNIA, CHRONIC 05/22/2007  . PERIPHERAL AUTONOMIC NEUROPATHY D/O CLASS ELSW 10/27/2008  . HYPERTENSION 11/26/2006  . COPD 12/06/2006  . Slow transit constipation 05/22/2007  . OSTEOARTHROSIS, LOCAL NOS, LOWER LEG 12/12/2006  . Osteoarth NOS-Unspec 12/06/2006   . EDEMA LEG 09/28/2009  . MASS, LUNG 10/07/2009  . NON-HODGKIN'S LYMPHOMA 09/04/2007  . Prostate ca   . Non hodgkins lymphoma      Past Surgical History Past Surgical History  Procedure Date  . Hemorrhoid surgery   .  skin cancers   . Cataract extraction, bilateral      Social History: History   Social History  . Marital Status: Widowed    Spouse Name: N/A    Number of Children: 5  . Years of Education: N/A   Occupational History  . Retired Journalist, newspaper    Social History Main Topics  . Smoking status: Former Smoker    Quit date: 05/01/1950  . Smokeless tobacco: Never Used  . Alcohol Use: No  . Drug Use: No  . Sexually Active: No   Other Topics Concern  . Not on file   Social History Narrative   Resident of [facility].  Widowed.  Ambulates with a walker.    Family History:  Family History  Problem Relation Age of Onset  . Pneumonia Mother   . Cancer Father   . Breast cancer Daughter   . Testicular cancer Son     Allergies: Review of patient's allergies indicates no known allergies.  Meds: Prior to Admission medications   Medication Sig Start Date End Date Taking? Authorizing Provider  acetaminophen (TYLENOL) 500 MG tablet Take 1,000 mg by mouth every 6 (six) hours as needed. For pain.    Yes Historical Provider, MD  azithromycin (ZITHROMAX) 250 MG tablet Take 250-500 mg by mouth daily. Take 2 tablets day  one and take 1 tablet daily for four days and stop 04/03/12  Yes Historical Provider, MD  diphenhydramine-acetaminophen (TYLENOL PM) 25-500 MG TABS Take 1 tablet by mouth at bedtime as needed.   Yes Historical Provider, MD  feeding supplement (ENSURE IMMUNE HEALTH) LIQD Take 237 mLs by mouth 3 (three) times daily with meals.   Yes Historical Provider, MD  Folic Acid-Vit B6-Vit B12 (FOLBEE PO) Take 1 tablet by mouth daily.    Yes Historical Provider, MD  furosemide (LASIX) 20 MG tablet Take 20 mg by mouth daily.     Yes Historical Provider, MD   gabapentin (NEURONTIN) 300 MG capsule Take 300 mg by mouth 2 (two) times daily.    Yes Historical Provider, MD  Leuprolide Acetate, 6 Month, (LUPRON DEPOT) 45 MG injection Inject 45 mg into the muscle every 6 (six) months.   Yes Historical Provider, MD  magnesium hydroxide (MILK OF MAGNESIA) 400 MG/5ML suspension Take 30 mLs by mouth daily as needed. constipation   Yes Historical Provider, MD  Melatonin 3 MG CAPS Take 1 capsule (3 mg total) by mouth at bedtime. 06/05/11  Yes Stacie Glaze, MD  metoprolol tartrate (LOPRESSOR) 25 MG tablet Take 12.5 mg by mouth 2 (two) times daily.     Yes Historical Provider, MD  simvastatin (ZOCOR) 40 MG tablet Take 40 mg by mouth every evening.   Yes Historical Provider, MD  Tamsulosin HCl (FLOMAX) 0.4 MG CAPS Take 1 capsule (0.4 mg total) by mouth daily. 06/05/11  Yes Stacie Glaze, MD    Physical Exam: Filed Vitals:   04/04/12 0748 04/04/12 1204 04/04/12 1323  BP: 186/65 153/61 150/87  Pulse: 85 88 86  Temp: 100.3 F (37.9 C) 98.9 F (37.2 C) 97.8 F (36.6 C)  TempSrc: Rectal Oral Oral  Resp: 26 20 20   Height:  5\' 6"  (1.676 m)   Weight:  89.812 kg (198 lb)   SpO2: 94% 100% 97%     Physical Exam: Blood pressure 150/87, pulse 86, temperature 97.8 F (36.6 C), temperature source Oral, resp. rate 20, height 5\' 6"  (1.676 m), weight 89.812 kg (198 lb), SpO2 97.00%. Gen: No acute distress. Head: Normocephalic, atraumatic. Eyes: Pupils equal, round reactive to light. Extraocular movements intact. Lens implants noted bilaterally. Sclerae nonicteric. Mouth: Oropharynx is clear with moist mucous membranes. Neck: Supple, no thyromegaly, no lymphadenopathy, no jugular venous distention. Chest: Coarse with scattered rhonchi and upper airway congestion noted. CV: Regular rate, and rhythm. No murmurs, rubs, or gallops. Abdomen: Soft, nontender, nondistended with normal active bowel sounds. Extremities: Right lower extremity wrapped in an Unna boot. Left lower  strandy without edema. Skin: Warm and dry. No rashes. Neuro: Alert and oriented x3. Cranial nerves II through XII are grossly intact. Nonfocal. Psych: Affect slightly depressed.  Labs on Admission:  Basic Metabolic Panel:  Lab 04/04/12 1610  NA 125*  K 4.9  CL 93*  CO2 22  GLUCOSE 125*  BUN 23  CREATININE 1.32  CALCIUM 9.1  MG --  PHOS --   Liver Function Tests:  Lab 04/04/12 0820  AST 31  ALT 24  ALKPHOS 67  BILITOT 0.3  PROT 7.0  ALBUMIN 3.6   CBC:  Lab 04/04/12 0820  WBC 12.0*  NEUTROABS 9.6*  HGB 12.0*  HCT 34.0*  MCV 89.2  PLT 210    Radiological Exams on Admission: Dg Chest 2 View (if Patient Has Fever And/or Copd)  04/04/2012  *RADIOLOGY REPORT*  Clinical Data: Cough, congestion  CHEST -  2 VIEW  Comparison: 07/10/2011  Findings: Ill-defined left mid lung increased opacity concerning for developing pneumonia, possibly in the lingula.  Right lung clear.  Stable heart size and vascularity.  No effusion or pneumothorax.  IMPRESSION: Faint ill-defined left midlung/lingula density, concerning for developing airspace process or pneumonia.   Original Report Authenticated By: Judie Petit. Miles Costain, M.D.     EKG: Independently reviewed. Normal sinus rhythm at 85 beats per minute. Right bundle branch block.  Assessment/Plan Principal Problem:  *Healthcare-associated pneumonia  Admit and place on empiric cefepime, vancomycin and Levaquin.  Nebulized bronchodilator therapy 4 times a day.  Supplemental oxygen. Active Problems:  NON-HODGKIN'S LYMPHOMA, in remission  Status post 4 cycles of CVP/Rituxan with the last chemotherapy given 08/22/2007. Disease is considered to be in clinical remission.  HYPERTENSION  Continue usual home medications including Lasix, and metoprolol.  PROSTATE CANCER, HX OF  Being managed with Lupron. Continue Flomax.  Hyponatremia  Likely SIADH from lung process but hypovolemia is in the differential. We'll gently hydrate and monitor.   Normocytic anemia  Mild, history of B12 deficiency. Continue B12 supplementation.  Hyperlipidemia  Continue statin.  Code Status: Full. Family Communication: Daughter Caleen Essex (daughter) [phone], cell [phone] Disposition Plan: ALF when stable.  Time spent: 1 hour.  RAMA,CHRISTINA [facility] Pager [phone]  If 7PM-7AM, please contact night-coverage [email or web address] Password [email or web address] 04/04/2012, 1:47 PM

## 2012-04-04 NOTE — Progress Notes (Signed)
ANTIBIOTIC CONSULT NOTE - INITIAL  Pharmacy Consult for vancomycin, cefepime, levaquin Indication: pneumonia  No Known Allergies  Patient Measurements: Height: 5\' 6"  (167.6 cm) Weight: 198 lb (89.812 kg) IBW/kg (Calculated) : 63.8   Vital Signs: Temp: 97.8 F (36.6 C) (12/05 1323) Temp src: Oral (12/05 1323) BP: 150/87 mmHg (12/05 1323) Pulse Rate: 86  (12/05 1323) Intake/Output from previous day:   Intake/Output from this shift: Total I/O In: 240 [P.O.:240] Out: 450 [Urine:450]  Labs:  [facility] 04/04/12 0820  WBC 12.0*  HGB 12.0*  PLT 210  LABCREA --  CREATININE 1.32   Estimated Creatinine Clearance: 33.6 ml/min (by C-G formula based on Cr of 1.32). No results found for this basename: VANCOTROUGH:2,VANCOPEAK:2,VANCORANDOM:2,GENTTROUGH:2,GENTPEAK:2,GENTRANDOM:2,TOBRATROUGH:2,TOBRAPEAK:2,TOBRARND:2,AMIKACINPEAK:2,AMIKACINTROU:2,AMIKACIN:2, in the last 72 hours   Microbiology: No results found for this or any previous visit (from the past 720 hour(s)).  Medical History: Past Medical History  Diagnosis Date  . ANEMIA, B12 DEFICIENCY 12/06/2006  . INSOMNIA, CHRONIC 05/22/2007  . PERIPHERAL AUTONOMIC NEUROPATHY D/O CLASS ELSW 10/27/2008  . HYPERTENSION 11/26/2006  . COPD 12/06/2006  . Slow transit constipation 05/22/2007  . OSTEOARTHROSIS, LOCAL NOS, LOWER LEG 12/12/2006  . Osteoarth NOS-Unspec 12/06/2006  . EDEMA LEG 09/28/2009  . MASS, LUNG 10/07/2009  . NON-HODGKIN'S LYMPHOMA 09/04/2007  . Prostate ca   . Non hodgkins lymphoma     Medications:  Scheduled:    . sodium chloride   Intravenous STAT  . [COMPLETED] acetaminophen  650 mg Oral Once  . albuterol  2.5 mg Nebulization QID  . ceFEPime (MAXIPIME) IV  1 g Intravenous Q24H  . enoxaparin  40 mg Subcutaneous Q24H  . feeding supplement  237 mL Oral TID WC  . furosemide  20 mg Oral Daily  . gabapentin  300 mg Oral BID  . l-methylfolate-B6-B12  1 tablet Oral Daily  . levofloxacin (LEVAQUIN) IV  750 mg Intravenous Q48H   . metoprolol tartrate  12.5 mg Oral BID  . [COMPLETED] piperacillin-tazobactam (ZOSYN)  IV  3.375 g Intravenous Once  . simvastatin  40 mg Oral QPM  . sodium chloride  3 mL Intravenous Q12H  . Tamsulosin HCl  0.4 mg Oral Daily  . [COMPLETED] vancomycin  1,000 mg Intravenous Once  . [DISCONTINUED] feeding supplement  237 mL Oral TID WC  . [DISCONTINUED] Folic Acid-Vit B6-Vit B12  1 tablet Oral Daily  . [DISCONTINUED] Melatonin  3 mg Oral QHS  . [COMPLETED] piperacillin-tazobactam (ZOSYN)  IV  3.375 g Intravenous Once   Infusions:    . sodium chloride     Assessment: 78 yo male with hx NHL and prostate cancer admitted with HCAP to start broad spectrum abx's  Goal of Therapy:  Vancomycin trough level 15-20 mcg/ml  Plan:  1) Vancomycin 1g IV q24 x 8 days 2) Cefepime 1g IV q24 x 8 days 3) Levaquin 750mg  IV q48 x 3 days   [name], PharmD, BCPS Pager [phone] 04/04/2012 2:41 PM

## 2012-04-04 NOTE — Progress Notes (Signed)
PHARMACIST - PHYSICIAN ORDER COMMUNICATION  CONCERNING: P&T Medication Policy on Herbal Medications  DESCRIPTION:  This patient's order for:  Melatonin  has been noted.  This product(s) is classified as an "herbal" or natural product. Due to a lack of definitive safety studies or FDA approval, nonstandard manufacturing practices, plus the potential risk of unknown drug-drug interactions while on inpatient medications, the Pharmacy and Therapeutics Committee does not permit the use of "herbal" or natural products of this type within [facility].   ACTION TAKEN: The pharmacy department is unable to verify this order at this time and your patient has been informed of this safety policy. Please reevaluate patient's clinical condition at discharge and address if the herbal or natural product(s) should be resumed at that time.   Geoffry Paradise, PharmD, BCPS Pager: [phone] 2:08 PM Pharmacy #: [id number]

## 2012-04-04 NOTE — ED Notes (Signed)
Pt in from [facility]. Reports productive cough since yesterday, brown-tinged phlegm. C/o all over body soreness, worse after coughing. Pt on zithromax per [facility], facility unable to tell ems why pt is on this.

## 2012-04-05 DIAGNOSIS — E66811 Obesity, class 1: Secondary | ICD-10-CM | POA: Diagnosis present

## 2012-04-05 DIAGNOSIS — E669 Obesity, unspecified: Secondary | ICD-10-CM | POA: Diagnosis present

## 2012-04-05 LAB — D-DIMER, QUANTITATIVE: D-Dimer, Quant: 0.64 ug/mL-FEU — ABNORMAL HIGH (ref 0.00–0.48)

## 2012-04-05 LAB — LEGIONELLA ANTIGEN, URINE: Legionella Antigen, Urine: NEGATIVE

## 2012-04-05 LAB — EXPECTORATED SPUTUM ASSESSMENT W GRAM STAIN, RFLX TO RESP C

## 2012-04-05 LAB — GLUCOSE, CAPILLARY: Glucose-Capillary: 223 mg/dL — ABNORMAL HIGH (ref 70–99)

## 2012-04-05 LAB — STREP PNEUMONIAE URINARY ANTIGEN: Strep Pneumo Urinary Antigen: NEGATIVE

## 2012-04-05 MED ORDER — LEVOFLOXACIN 750 MG PO TABS: 750.0000 mg | ORAL_TABLET | Freq: Every day | ORAL | Status: DC

## 2012-04-05 MED ORDER — ALBUTEROL SULFATE (5 MG/ML) 0.5% IN NEBU
2.5000 mg | INHALATION_SOLUTION | Freq: Three times a day (TID) | RESPIRATORY_TRACT | Status: DC
  Administered 2012-04-05 – 2012-04-06 (×3): 2.5 mg via RESPIRATORY_TRACT
  Filled 2012-04-05 (×3): qty 0.5

## 2012-04-05 MED ORDER — LEVOFLOXACIN 750 MG PO TABS
750.0000 mg | ORAL_TABLET | ORAL | Status: DC
  Administered 2012-04-06: 750 mg via ORAL
  Filled 2012-04-05: qty 1

## 2012-04-05 MED ORDER — ALBUTEROL SULFATE (5 MG/ML) 0.5% IN NEBU
INHALATION_SOLUTION | RESPIRATORY_TRACT | Status: AC
  Administered 2012-04-05: 2.5 mg
  Filled 2012-04-05: qty 0.5

## 2012-04-05 MED ORDER — BOOST / RESOURCE BREEZE PO LIQD
1.0000 | Freq: Two times a day (BID) | ORAL | Status: DC
  Administered 2012-04-05 – 2012-04-06 (×3): 1 via ORAL

## 2012-04-05 NOTE — Progress Notes (Signed)
CARE MANAGEMENT NOTE 04/05/2012  Patient:  Antonio Bradley, Antonio Bradley   Account Number:  [id number]  Date Initiated:  04/05/2012  Documentation initiated by:  PEARSON,COOKIE  Subjective/Objective Assessment:   78yo male with pneumonia     Action/Plan:   [facility]   Anticipated DC Date:  04/08/2012   Anticipated DC Plan:  ASSISTED LIVING / REST HOME  In-house referral  Clinical Social Worker      DC Planning Services  CM consult      Choice offered to / List presented to:             Status of service:  In process, will continue to follow Medicare Important Message given?  NA - LOS <3 / Initial given by admissions (If response is "NO", the following Medicare IM given date fields will be blank) Date Medicare IM given:   Date Additional Medicare IM given:    Discharge Disposition:    Per UR Regulation:  Reviewed for med. necessity/level of care/duration of stay  If discussed at Long Length of Stay Meetings, dates discussed:    Comments:  04/05/12 MPearson, RN, BSN Chart reviewed.

## 2012-04-05 NOTE — Progress Notes (Signed)
Clinical Social Work Department BRIEF PSYCHOSOCIAL ASSESSMENT 04/05/2012  Patient:  Antonio Bradley, Antonio Bradley     Account Number:  [id number]     Admit date:  04/04/2012  Clinical Social Worker:  Jacelyn Grip  Date/Time:  04/05/2012 10:00 AM  Referred by:  Physician  Date Referred:  04/05/2012 Referred for  ALF Placement   Other Referral:   Interview type:  Patient Other interview type:    PSYCHOSOCIAL DATA Living Status:  FACILITY Admitted from facility:  [facility] [address] Level of care:  Assisted Living Primary support name:  Janelle Martin/[phone] Primary support relationship to patient:  CHILD, ADULT Degree of support available:   unknown at this time, no family at bedside    CURRENT CONCERNS Current Concerns  Post-Acute Placement   Other Concerns:    SOCIAL WORK ASSESSMENT / PLAN CSW received referral that pt admitted from [facility]. CSW met with pt at bedside to discuss. Pt confirms that he is a resident of [facility] and plans to return at discharge. CSW contacted [facility] who stated that pt can return at discharge, but unsure if pt could re admit to facility over weekend and would have to speak to facility RN about that who was currently in a regional meeting. CSW to continue to follow and facilitate pt discharge needs back to [facility] when pt medically stable for discharge.   Assessment/plan status:  Psychosocial Support/Ongoing Assessment of Needs Other assessment/ plan:   discharge planning   Information/referral to community resources:   Referral back to [facility]    PATIENT'S/FAMILY'S RESPONSE TO PLAN OF CARE: Pt alert and oriented x 4. Pt eager to return to [facility].      Jacklynn Lewis, MSW, LCSWA  Clinical Social Work [phone]

## 2012-04-05 NOTE — Progress Notes (Addendum)
[facility] PROGRESS NOTE  Antonio Bradley ZOX:[id number] DOB: 08-May-1912 DOA: 04/04/2012 PCP: Antonio Aschoff, FNP  Brief narrative: Antonio Bradley is an 78 y.o. male with a past medical history of non-Hodgkin's lymphoma, in remission, who presented to the hospital with a chief complaint of cough, congestion, body aches, and fever for the past 24-48 hours. He was put on azithromycin at his facility, but despite initiation of antibiotics, his symptoms have worsened. He has received his flu vaccine for the season. He has had a pneumococcal vaccine as well. Some of the residents at his ALF have been sick with similar symptoms. The patient was noted to have an infiltrate on initial chest radiography and was referred for inpatient treatment   Assessment/Plan: Principal Problem:  *Healthcare-associated pneumonia   Admitted and placed on empiric cefepime, vancomycin and Levaquin. Strep pneumonia antigen negative. Legionella antigen pending. Sputum culture pending. Blood cultures negative to date.  Continue nebulized bronchodilator therapy 4 times a day.   Continue supplemental oxygen.  Given hemoptysis, check d-dimer. If elevated, will get a CT of the chest to rule out pulmonary embolism. Active Problems: Class I obesity  Dietitian evaluation performed 04/05/2012.  NON-HODGKIN'S LYMPHOMA, in remission   Status post 4 cycles of CVP/Rituxan with the last chemotherapy given 08/22/2007. Disease is considered to be in clinical remission. HYPERTENSION   Continue usual home medications including Lasix, and metoprolol. PROSTATE CANCER, HX OF   Being managed with Lupron. Continue Flomax. Hyponatremia   Likely SIADH from lung process but hypovolemia is in the differential. We'll gently hydrate and monitor. Normocytic anemia   Mild, history of B12 deficiency. Continue B12 supplementation. Hyperlipidemia   Continue statin.  Code Status: Full.  Family Communication: Daughter Antonio Bradley (daughter) [phone], cell [phone], updated at bedside  Disposition Plan: ALF when stable.   Medical Consultants:  None.  Other Consultants:  Physical therapy  Dietitian  Anti-infectives:  Cefepime 04/04/2012---> 04/05/2012  Vancomycin 04/04/2012---> 04/05/2012   Levaquin 04/04/2012--->  HPI/Subjective: Mr. Antonio Bradley feels well today. He wants to get of bed. Denies shortness of breath. He is having some hemoptysis and a cough.  Objective: Filed Vitals:   04/04/12 2246 04/05/12 0155 04/05/12 0625 04/05/12 0940  BP: 126/50 136/58 135/53   Pulse: 58 59    Temp:  97.8 F (36.6 C) 97.1 F (36.2 C)   TempSrc:  Oral Oral   Resp:  16 16   Height:      Weight:      SpO2:  98% 95% 99%    Intake/Output Summary (Last 24 hours) at 04/05/12 1206 Last data filed at 04/05/12 3086  Gross per 24 hour  Intake   2275 ml  Output   2825 ml  Net   -550 ml    Exam: Gen:  NAD Cardiovascular:  RRR, No M/R/G Respiratory:  Lungs CTAB Gastrointestinal:  Abdomen soft, NT/ND, + BS Extremities:  No C/E/C  Data Reviewed: Basic Metabolic Panel:  Lab 04/04/12 5784  NA 125*  K 4.9  CL 93*  CO2 22  GLUCOSE 125*  BUN 23  CREATININE 1.32  CALCIUM 9.1  MG --  PHOS --   GFR Estimated Creatinine Clearance: 33.6 ml/min (by C-G formula based on Cr of 1.32). Liver Function Tests:  Lab 04/04/12 0820  AST 31  ALT 24  ALKPHOS 67  BILITOT 0.3  PROT 7.0  ALBUMIN 3.6   CBC:  Lab 04/04/12 0820  WBC 12.0*  NEUTROABS 9.6*  HGB 12.0*  HCT 34.0*  MCV  89.2  PLT 210   Microbiology Recent Results (from the past 240 hour(s))  CULTURE, BLOOD (ROUTINE X 2)     Status: Normal (Preliminary result)   Collection Time   04/04/12  8:50 AM      Component Value Range Status Comment   Specimen Description BLOOD LEFT WRIST   Final    Special Requests BOTTLES DRAWN AEROBIC AND ANAEROBIC 4CC   Final    Culture  Setup Time 04/04/2012 14:09   Final    Culture     Final    Value:        BLOOD  CULTURE RECEIVED NO GROWTH TO DATE CULTURE WILL BE HELD FOR 5 DAYS BEFORE ISSUING A FINAL NEGATIVE REPORT   Report Status PENDING   Incomplete   CULTURE, BLOOD (ROUTINE X 2)     Status: Normal (Preliminary result)   Collection Time   04/04/12  8:50 AM      Component Value Range Status Comment   Specimen Description BLOOD RIGHT HAND   Final    Special Requests BOTTLES DRAWN AEROBIC AND ANAEROBIC 5CC   Final    Culture  Setup Time 04/04/2012 14:09   Final    Culture     Final    Value:        BLOOD CULTURE RECEIVED NO GROWTH TO DATE CULTURE WILL BE HELD FOR 5 DAYS BEFORE ISSUING A FINAL NEGATIVE REPORT   Report Status PENDING   Incomplete   CULTURE, EXPECTORATED SPUTUM-ASSESSMENT     Status: Normal   Collection Time   04/04/12 10:50 PM      Component Value Range Status Comment   Specimen Description SPUTUM   Final    Special Requests Normal   Final    Sputum evaluation     Final    Value: THIS SPECIMEN IS ACCEPTABLE. RESPIRATORY CULTURE REPORT TO FOLLOW.   Report Status 04/05/2012 FINAL   Final   CULTURE, RESPIRATORY     Status: Normal (Preliminary result)   Collection Time   04/04/12 10:50 PM      Component Value Range Status Comment   Specimen Description SPUTUM   Final    Special Requests NONE   Final    Gram Stain     Final    Value: FEW WBC PRESENT,BOTH PMN AND MONONUCLEAR     FEW SQUAMOUS EPITHELIAL CELLS PRESENT     FGNR   Culture PENDING   Incomplete    Report Status PENDING   Incomplete      Procedures and Diagnostic Studies: Dg Chest 2 View (if Patient Has Fever And/or Copd)  04/04/2012  *RADIOLOGY REPORT*  Clinical Data: Cough, congestion  CHEST - 2 VIEW  Comparison: 07/10/2011  Findings: Ill-defined left mid lung increased opacity concerning for developing pneumonia, possibly in the lingula.  Right lung clear.  Stable heart size and vascularity.  No effusion or pneumothorax.  IMPRESSION: Faint ill-defined left midlung/lingula density, concerning for developing airspace  process or pneumonia.   Original Report Authenticated By: Antonio Bradley. Antonio Bradley, M.D.     Scheduled Meds:   . [COMPLETED] sodium chloride   Intravenous STAT  . albuterol  2.5 mg Nebulization QID  . ceFEPime (MAXIPIME) IV  1 g Intravenous Q24H  . enoxaparin  40 mg Subcutaneous Q24H  . feeding supplement  1 Container Oral BID BM  . furosemide  20 mg Oral Daily  . gabapentin  300 mg Oral BID  . l-methylfolate-B6-B12  1 tablet Oral Daily  . levofloxacin (  LEVAQUIN) IV  750 mg Intravenous Q48H  . metoprolol tartrate  12.5 mg Oral BID  . simvastatin  40 mg Oral QPM  . sodium chloride  3 mL Intravenous Q12H  . Tamsulosin HCl  0.4 mg Oral Daily  . [COMPLETED] vancomycin  1,000 mg Intravenous Once  . vancomycin  1,000 mg Intravenous Q24H  . [DISCONTINUED] feeding supplement  237 mL Oral TID WC  . [DISCONTINUED] feeding supplement  237 mL Oral TID WC  . [DISCONTINUED] Folic Acid-Vit B6-Vit B12  1 tablet Oral Daily  . [DISCONTINUED] Melatonin  3 mg Oral QHS  . [COMPLETED] piperacillin-tazobactam (ZOSYN)  IV  3.375 g Intravenous Once   Continuous Infusions:   . sodium chloride 75 mL/hr at 04/05/12 0410    Time spent: 25 minutes   LOS: 1 day   Dakarai Mcglocklin  [facility] Pager [phone].  If 8PM-8AM, please contact night-coverage at [email or web address], password [facility] 04/05/2012, 12:06 PM

## 2012-04-05 NOTE — Progress Notes (Signed)
Patient ambulated in hall with walker and standby assist.  Patient ambulated 300 ft. while on 2 liters o2.  Philomena Doheny RN

## 2012-04-05 NOTE — Progress Notes (Signed)
Nutrition Brief Note  Patient identified on the Malnutrition Screening Tool (MST) Report  Body mass index is 31.96 kg/(m^2). Pt meets criteria for class I obesity based on current BMI.   Current diet order is regular, patient is consuming approximately 85-90% of meals at this time. Labs and medications reviewed. Met with pt and daughter who report pt eating well PTA with stable weight. Pt c/o no appetite for several months however pt's daughter stated that he "always says that, but he eats well". Pt reports some problems chewing. Pt had history of problems swallowing and was seen by SLP at [facility], however daughter reported this has resolved and pt was not on any special diet. Changed Ensure to [employer] as pt enjoyed [employer] better.   No nutrition interventions warranted at this time. If nutrition issues arise, please consult RD.   Levon Hedger MS, RD, LDN [phone] Pager [phone] After Hours Pager

## 2012-04-05 NOTE — Progress Notes (Signed)
At 2126 pts BP manually was 124/48 HR 57.  Provider on call notified and orders given to recheck BP in one hour and hold lopressor if BP is not within parameters.  One hour later BP rechecked manually and was 126/50 HR 58.  Will hold lopressor tonight and will continue to monitor.  Fatima Sanger A

## 2012-04-06 LAB — CREATININE, SERUM
Creatinine, Ser: 1.44 mg/dL — ABNORMAL HIGH (ref 0.50–1.35)
GFR calc non Af Amer: 39 mL/min — ABNORMAL LOW (ref >90)

## 2012-04-06 LAB — URINE CULTURE

## 2012-04-06 LAB — CLOSTRIDIUM DIFFICILE BY PCR: Toxigenic C. Difficile by PCR: POSITIVE — AB

## 2012-04-06 MED ORDER — METRONIDAZOLE 500 MG PO TABS: 500.0000 mg | ORAL_TABLET | Freq: Three times a day (TID) | ORAL | Status: DC

## 2012-04-06 MED ORDER — METRONIDAZOLE 500 MG PO TABS
500.0000 mg | ORAL_TABLET | Freq: Three times a day (TID) | ORAL | Status: DC
  Administered 2012-04-06: 500 mg via ORAL
  Filled 2012-04-06 (×3): qty 1

## 2012-04-06 MED ORDER — LEVOFLOXACIN 750 MG PO TABS: 750.0000 mg | ORAL_TABLET | ORAL | Status: DC

## 2012-04-06 MED ORDER — ZOLPIDEM TARTRATE 5 MG PO TABS
5.0000 mg | ORAL_TABLET | Freq: Once | ORAL | Status: AC
  Administered 2012-04-06: 5 mg via ORAL
  Filled 2012-04-06: qty 1

## 2012-04-06 NOTE — Progress Notes (Signed)
[facility], spoke with nurse on site. Informed nurse about new medications, and that pt will be followed by Home Health.

## 2012-04-06 NOTE — Evaluation (Signed)
Physical Therapy Evaluation Patient Details Name: Antonio Bradley MRN: [id number] DOB: 1912-10-30 Today's Date: 04/06/2012 Time: 1205-1225 PT Time Calculation (min): 20 min  PT Assessment / Plan / Recommendation Clinical Impression  Patient admitted with pneumonia presents at supervision level for mobility with walker.  Will have appropriate level of care at assisted living.  Feel will benefit from home health PT once discharged home.  If remains in acute setting will follow for skilled PT to address decreased balance, decreased safety with transfers, LE weakness and limited activity tolerance.    PT Assessment  Patient needs continued PT services    Follow Up Recommendations  Home health PT;Supervision - Intermittent                Equipment Recommendations    None at this time.   Recommendations for Other Services   None at this time.  Frequency Min 3X/week    Precautions / Restrictions Precautions Precautions: Fall Restrictions Weight Bearing Restrictions: No   Pertinent Vitals/Pain No pain except in knees with transfers     Mobility  Bed Mobility Details for Bed Mobility Assistance: Not tested, patient in recliner Transfers Transfers: Sit to Stand;Stand to Sit Sit to Stand: 5: Supervision;With upper extremity assist;From bed;With armrests;From chair/3-in-1 Stand to Sit: 5: Supervision;With upper extremity assist;With armrests;To bed Details for Transfer Assistance: from recliner with armrests and increased height sueprvision only due to initial posterior balance.  From bed increased time required and struggling to get up without assist. Ambulation/Gait Ambulation/Gait Assistance: 5: Supervision Ambulation Distance (Feet): 80 Feet Assistive device: Rolling walker Ambulation/Gait Assistance Details: forward flexed with increased proximity from walker.  Has decreased foot clearance, decreased step length and had posterior loss of balance with supervision recovery  initially standing and turning to go out door.                PT Diagnosis: Abnormality of gait  PT Problem List: Decreased strength;Decreased activity tolerance;Decreased knowledge of use of DME;Decreased balance;Decreased mobility PT Treatment Interventions: Gait training;DME instruction;Functional mobility training;Patient/family education;Therapeutic exercise;Balance training;Therapeutic activities;Neuromuscular re-education   PT Goals Acute Rehab PT Goals PT Goal Formulation: With patient Time For Goal Achievement: 04/13/12 Potential to Achieve Goals: Good Pt will go Sit to Stand: with modified independence PT Goal: Sit to Stand - Progress: Goal set today Pt will go Stand to Sit: with modified independence PT Goal: Stand to Sit - Progress: Goal set today Pt will Stand: with modified independence;1 - 2 min;with unilateral upper extremity support (during functional activity) PT Goal: Stand - Progress: Goal set today Pt will Ambulate: 51 - 150 feet;with rolling walker;with modified independence PT Goal: Ambulate - Progress: Goal set today  Visit Information  Last PT Received On: 04/06/12 Assistance Needed: +1    Subjective Data  Subjective: Going home Patient Stated Goal: To return to [employer]   Prior Functioning  Home Living Lives With: Alone Available Help at Discharge: Skilled Nursing Facility Type of Home: Assisted living Home Layout: One level Home Adaptive Equipment: Walker - rolling Prior Function Level of Independence: Independent with assistive device(s) Communication Communication: HOH    Cognition  Overall Cognitive Status: Appears within functional limits for tasks assessed/performed Arousal/Alertness: Awake/alert Orientation Level: Appears intact for tasks assessed Behavior During Session: [facility] for tasks performed    Extremity/Trunk Assessment Right Lower Extremity Assessment RLE ROM/Strength/Tone: Deficits RLE ROM/Strength/Tone Deficits:  functional knee extension/hip extension weakness with sit <>stand Left Lower Extremity Assessment LLE ROM/Strength/Tone: Deficits LLE ROM/Strength/Tone Deficits: functional knee extension/hip  extension weakness with sit <>stand Trunk Assessment Trunk Assessment: Kyphotic       End of Session PT - End of Session Equipment Utilized During Treatment: Gait belt Activity Tolerance: Patient tolerated treatment well Patient left: in chair;with call bell/phone within reach;with family/visitor present  GP    Sheran Lawless, PT [phone] 04/06/2012  WYNN,CYNDI 04/06/2012, 1:05 PM

## 2012-04-06 NOTE — Progress Notes (Signed)
Pt to be d/c today to [facility].  Pt and family agreeable. Confirmed plans with facility.  Plan transfer via daughter to facility.   Pt will transport after lab results return.  Leron Croak, LCSWA [employer] [phone]

## 2012-04-06 NOTE — Progress Notes (Signed)
Recalled [facility] to inform that pt is C. Diff Positive. Informed that pt had received first dose of flagyl and that prescription had been called in. Removed PIV, WNL. Pt d/c'd to [facility] with daughter who will drive pt there.

## 2012-04-06 NOTE — Progress Notes (Signed)
CRITICAL VALUE ALERT  Critical value received: C. Diff positive  Date of notification:  04/06/12  Time of notification:  1620  Critical value read back:yes  Nurse who received alert:  Jola Baptist RN  MD notified (1st page):  Dr. Darnelle Catalan  Time of first page:  1630  MD notified (2nd page):  Time of second page:  Responding MD:  Dr. Darnelle Catalan  Time MD responded:  [phone]

## 2012-04-06 NOTE — Discharge Summary (Addendum)
Physician Discharge Summary  Antonio Bradley XWR:[id number] DOB: 1913-01-12 DOA: 04/04/2012  PCP: Marlowe Aschoff, FNP  Admit date: 04/04/2012 Discharge date: 04/07/2012  Recommendations for Outpatient Follow-up:  1. Followup with primary care physician in one week for followup of pneumonia. 2. Recommend followup sodium in one week. 3. Followup final blood and sputum culture results.  Discharge Diagnoses:   Principal Problem:  *Healthcare-associated pneumonia Active Problems:   NON-HODGKIN'S LYMPHOMA, in remission   HYPERTENSION   PROSTATE CANCER, HX OF   Hyponatremia   Normocytic anemia   Hyperlipidemia   Obesity, Class I, BMI 30-34.9   Clostridium Difficile diarrhea   Discharge Condition: Improved.  Diet recommendation: Low-sodium, heart healthy.  History of present illness:  Antonio Bradley is an 78 y.o. male with a past medical history of non-Hodgkin's lymphoma, in remission, who presented to the hospital with a chief complaint of cough, congestion, body aches, and fever for the past 24-48 hours. He was put on azithromycin at his facility, but despite initiation of antibiotics, his symptoms had worsened. He has received his flu vaccine for the season. He has had a pneumococcal vaccine as well. Some of the residents at his ALF have been sick with similar symptoms. The patient was noted to have an infiltrate on initial chest radiography and was referred for inpatient treatment.   Hospital Course by problem:  Principal Problem:  *Healthcare-associated pneumonia  Admitted and placed on empiric cefepime, vancomycin and Levaquin. Strep pneumonia antigen negative. Legionella antigen pending. Sputum culture pending. Blood cultures negative to date. Given rapid improvement, his antibiotics were narrowed to Levaquin on 04/05/2012 and he continues to feel well and is requesting discharge. Given nebulized bronchodilator therapy 4 times a day while in the hospital as well as  supplemental oxygen.  Given hemoptysis, we did check a d-dimer. It was only mildly elevated the patient did not have any tachycardia and given his rapid improvement, we elected not to pursue CT scanning of the chest. Active Problems:  Clostridium difficile diarrhea  The patient had 2 diarrheal stools on the date of discharge.  A sample was sent for C. Diff PCR testing and his d/c was placed on hold until test results were back.  Testing confirmed C. Difficile.  The patient felt well, and wanted to be discharged back to his facility despite the positive test results.  He was given a dose of flagyl prior to discharge, and will be sent home on a 2 week course of therapy with Flagyl. Class I obesity  Dietitian evaluation performed 04/05/2012.  NON-HODGKIN'S LYMPHOMA, in remission  Status post 4 cycles of CVP/Rituxan with the last chemotherapy given 08/22/2007. Disease is considered to be in clinical remission. HYPERTENSION  Continue usual home medications including Lasix, and metoprolol. PROSTATE CANCER, HX OF  Being managed with Lupron. Continue Flomax. Hyponatremia  Likely SIADH from lung process but hypovolemia is in the differential. We gently hydrated him. Recommend followup sodium in one week. Normocytic anemia  Mild, history of B12 deficiency. Continue B12 supplementation. Hyperlipidemia  Continue statin.  Procedures:  None.  Consultations:  None.  Discharge Exam: Filed Vitals:   04/06/12 0926  BP:   Pulse: 92  Temp:   Resp:    Filed Vitals:   04/06/12 0648 04/06/12 0919 04/06/12 0926 04/06/12 1404  BP: 146/61     Pulse: 60  92   Temp: 97.3 F (36.3 C)     TempSrc: Oral     Resp: 16     Height:  Weight:      SpO2: 94% 94%  94%    Gen:  NAD Cardiovascular:  RRR, No M/R/G Respiratory: Lungs CTAB Gastrointestinal: Abdomen soft, NT/ND with normal active bowel sounds. Extremities: No C/E/C   Discharge Instructions      Discharge Orders    Future  Appointments: Provider: Department: Dept Phone: Center:   09/02/2012 2:15 PM Rana Snare, NP [facility] [phone] None     Future Orders Please Complete By Expires   Diet - low sodium heart healthy      Increase activity slowly      Call MD for:  temperature >100.4      Call MD for:      Scheduling Instructions:   Increasing shortness of breath or worsening cough with thick yellow or green mucus.   Home Health      Questions: Responses:   To provide the following care/treatments PT   Face-to-face encounter      Comments:   I RAMA,CHRISTINA certify that this patient is under my care and that I, or a nurse practitioner or physician's assistant working with me, had a face-to-face encounter that meets the physician face-to-face encounter requirements with this patient on 04/06/2012. The encounter with the patient was in whole, or in part for the following medical condition(s) which is the primary reason for home health care (List medical condition): Deconditioning after tx of pneumonia.   Questions: Responses:   The encounter with the patient was in whole, or in part, for the following medical condition, which is the primary reason for home health care Deconditioning after treatment for PNA   I certify that, based on my findings, the following services are medically necessary home health services Physical therapy   My clinical findings support the need for the above services Unable to leave home safely without assistance and/or assistive device   Further, I certify that my clinical findings support that this patient is homebound due to: Shortness of Breath with activity   To provide the following care/treatments PT       Medication List     As of 04/07/2012  2:35 PM    STOP taking these medications         azithromycin 250 MG tablet   Commonly known as: ZITHROMAX      TAKE these medications         acetaminophen 500 MG tablet   Commonly known as:  TYLENOL   Take 1,000 mg by mouth every 6 (six) hours as needed. For pain.      diphenhydramine-acetaminophen 25-500 MG Tabs   Commonly known as: TYLENOL PM   Take 1 tablet by mouth at bedtime as needed.      feeding supplement Liqd   Take 237 mLs by mouth 3 (three) times daily with meals.      FOLBEE PO   Take 1 tablet by mouth daily.      furosemide 20 MG tablet   Commonly known as: LASIX   Take 20 mg by mouth daily.      gabapentin 300 MG capsule   Commonly known as: NEURONTIN   Take 300 mg by mouth 2 (two) times daily.      levofloxacin 750 MG tablet   Commonly known as: LEVAQUIN   Take 1 tablet (750 mg total) by mouth every other day. For 6 more days.      LUPRON DEPOT 45 MG injection   Generic drug: Leuprolide Acetate (6  Month)   Inject 45 mg into the muscle every 6 (six) months.      magnesium hydroxide 400 MG/5ML suspension   Commonly known as: MILK OF MAGNESIA   Take 30 mLs by mouth daily as needed. constipation      Melatonin 3 MG Caps   Take 1 capsule (3 mg total) by mouth at bedtime.      metoprolol tartrate 25 MG tablet   Commonly known as: LOPRESSOR   Take 12.5 mg by mouth 2 (two) times daily.      metroNIDAZOLE 500 MG tablet   Commonly known as: FLAGYL   Take 1 tablet (500 mg total) by mouth 3 (three) times daily.      simvastatin 40 MG tablet   Commonly known as: ZOCOR   Take 40 mg by mouth every evening.      Tamsulosin HCl 0.4 MG Caps   Commonly known as: FLOMAX   Take 1 capsule (0.4 mg total) by mouth daily.        Follow-up Information    Follow up with [facility], FNP. Schedule an appointment as soon as possible for a visit in 1 week. (To followup on your pneumonia)           The results of significant diagnostics from this hospitalization (including imaging, microbiology, ancillary and laboratory) are listed below for reference.    Significant Diagnostic Studies: Dg Chest 2 View (if Patient Has Fever And/or Copd)  04/04/2012   *RADIOLOGY REPORT*  Clinical Data: Cough, congestion  CHEST - 2 VIEW  Comparison: 07/10/2011  Findings: Ill-defined left mid lung increased opacity concerning for developing pneumonia, possibly in the lingula.  Right lung clear.  Stable heart size and vascularity.  No effusion or pneumothorax.  IMPRESSION: Faint ill-defined left midlung/lingula density, concerning for developing airspace process or pneumonia.   Original Report Authenticated By: Judie Petit. Miles Costain, M.D.     Microbiology: Recent Results (from the past 240 hour(s))  URINE CULTURE     Status: Normal   Collection Time   04/04/12  8:07 AM      Component Value Range Status Comment   Specimen Description URINE, CLEAN CATCH   Final    Special Requests NONE   Final    Culture  Setup Time 04/04/2012 15:15   Final    Colony Count 45,000 COLONIES/ML   Final    Culture PROTEUS MIRABILIS   Final    Report Status 04/06/2012 FINAL   Final    Organism ID, Bacteria PROTEUS MIRABILIS   Final   CULTURE, BLOOD (ROUTINE X 2)     Status: Normal   Collection Time   04/04/12  8:50 AM      Component Value Range Status Comment   Specimen Description BLOOD LEFT WRIST   Final    Special Requests BOTTLES DRAWN AEROBIC AND ANAEROBIC 4CC   Final    Culture  Setup Time 04/04/2012 14:09   Final    Culture     Final    Value: DIPHTHEROIDS(CORYNEBACTERIUM SPECIES)     Note: Standardized susceptibility testing for this organism is not available.     Note: Gram Stain Report Called to,Read Back By and Verified With: SARA PADDEN 04/06/12 @ 1:15PM BY RUSCA.   Report Status 04/07/2012 FINAL   Final   CULTURE, BLOOD (ROUTINE X 2)     Status: Normal (Preliminary result)   Collection Time   04/04/12  8:50 AM      Component Value Range Status Comment  Specimen Description BLOOD RIGHT HAND   Final    Special Requests BOTTLES DRAWN AEROBIC AND ANAEROBIC 5CC   Final    Culture  Setup Time 04/04/2012 14:09   Final    Culture     Final    Value:        BLOOD CULTURE RECEIVED NO  GROWTH TO DATE CULTURE WILL BE HELD FOR 5 DAYS BEFORE ISSUING A FINAL NEGATIVE REPORT   Report Status PENDING   Incomplete   CULTURE, EXPECTORATED SPUTUM-ASSESSMENT     Status: Normal   Collection Time   04/04/12 10:50 PM      Component Value Range Status Comment   Specimen Description SPUTUM   Final    Special Requests Normal   Final    Sputum evaluation     Final    Value: THIS SPECIMEN IS ACCEPTABLE. RESPIRATORY CULTURE REPORT TO FOLLOW.   Report Status 04/05/2012 FINAL   Final   CULTURE, RESPIRATORY     Status: Normal   Collection Time   04/04/12 10:50 PM      Component Value Range Status Comment   Specimen Description SPUTUM   Final    Special Requests NONE   Final    Gram Stain     Final    Value: FEW WBC PRESENT,BOTH PMN AND MONONUCLEAR     FEW SQUAMOUS EPITHELIAL CELLS PRESENT     FEW GRAM NEGATIVE RODS   Culture NORMAL OROPHARYNGEAL FLORA   Final    Report Status 04/07/2012 FINAL   Final   CLOSTRIDIUM DIFFICILE BY PCR     Status: Abnormal   Collection Time   04/06/12 10:03 AM      Component Value Range Status Comment   C difficile by pcr POSITIVE (*) NEGATIVE Final      Labs: Basic Metabolic Panel:  Lab 04/06/12 4540 04/04/12 0820  NA -- 125*  K -- 4.9  CL -- 93*  CO2 -- 22  GLUCOSE -- 125*  BUN -- 23  CREATININE 1.44* 1.32  CALCIUM -- 9.1  MG -- --  PHOS -- --   Liver Function Tests:  Lab 04/04/12 0820  AST 31  ALT 24  ALKPHOS 67  BILITOT 0.3  PROT 7.0  ALBUMIN 3.6   CBC:  Lab 04/04/12 0820  WBC 12.0*  NEUTROABS 9.6*  HGB 12.0*  HCT 34.0*  MCV 89.2  PLT 210   CBG:  Lab 04/05/12 1215  GLUCAP 223*    Time coordinating discharge: 35 minutes.  Signed:  RAMA,CHRISTINA  Pager [phone] [facility] 04/07/2012, 2:35 PM

## 2012-04-06 NOTE — Progress Notes (Addendum)
CRITICAL VALUE ALERT  Critical value received:  Blood culture positive for gram positive rods  Date of notification:  04/06/12   Time of notification:  1315   Critical value read back: yes  Nurse who received alert: Jola Baptist RN  MD notified (1st page):  Dr. Darnelle Catalan  Time of first page:  1319  MD notified (2nd page):  Time of second page:  Responding MD: Rama  Time MD responded: [phone]

## 2012-04-07 LAB — CULTURE, BLOOD (ROUTINE X 2)

## 2012-04-07 LAB — CULTURE, RESPIRATORY W GRAM STAIN

## 2012-04-10 LAB — CULTURE, BLOOD (ROUTINE X 2)

## 2012-05-21 IMAGING — CR DG CHEST 2V
2 series · 2 of 2 positions shown · non-contrast
Comparison: 10/07/2009 chest x-ray.  06/17/2007 PET CT.

CLINICAL DATA: Abnormal chest x-ray.  Nonsmoker.History of non-
Hodgkins lymphoma.

CHEST - 2 VIEW

[w chest pa]
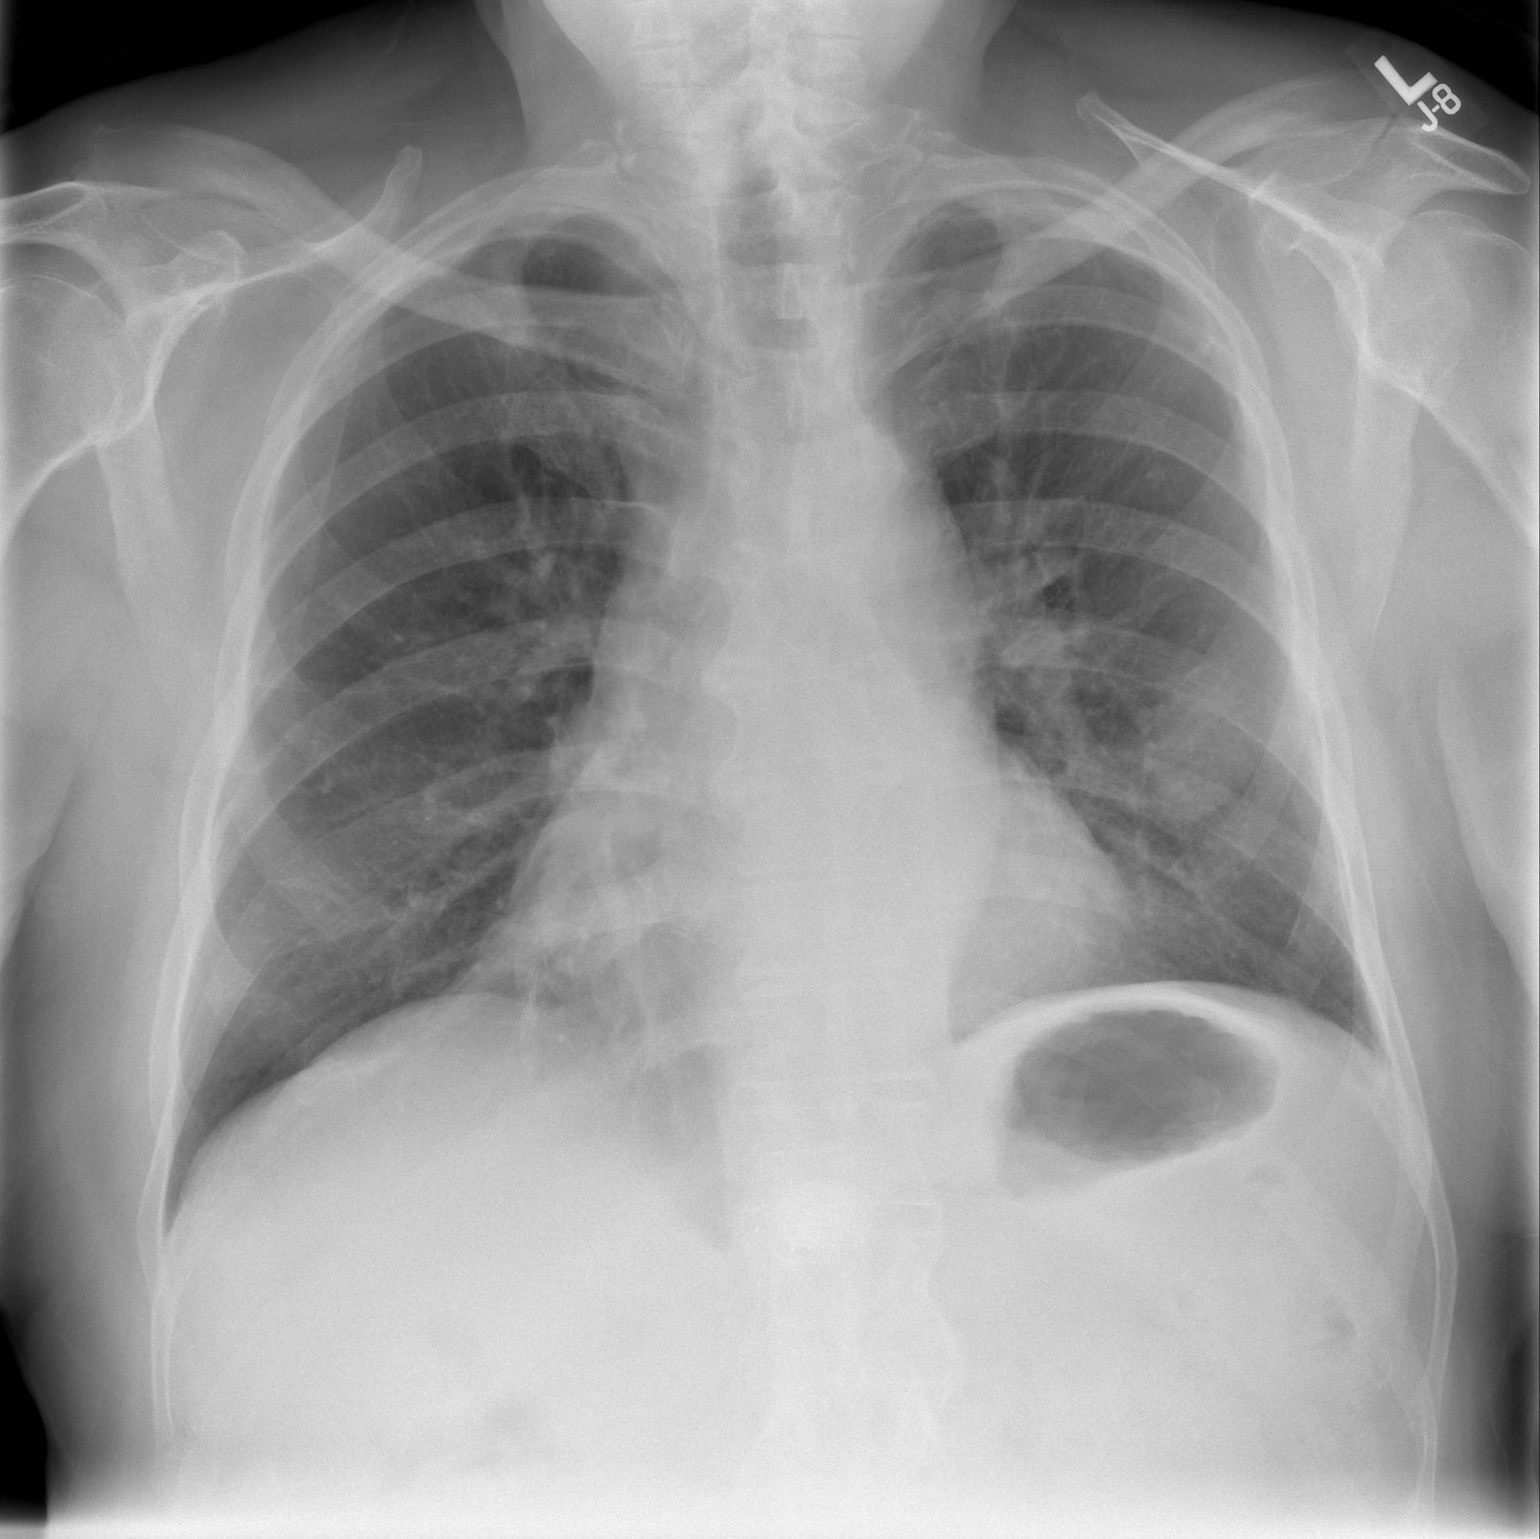

[w chest lat]
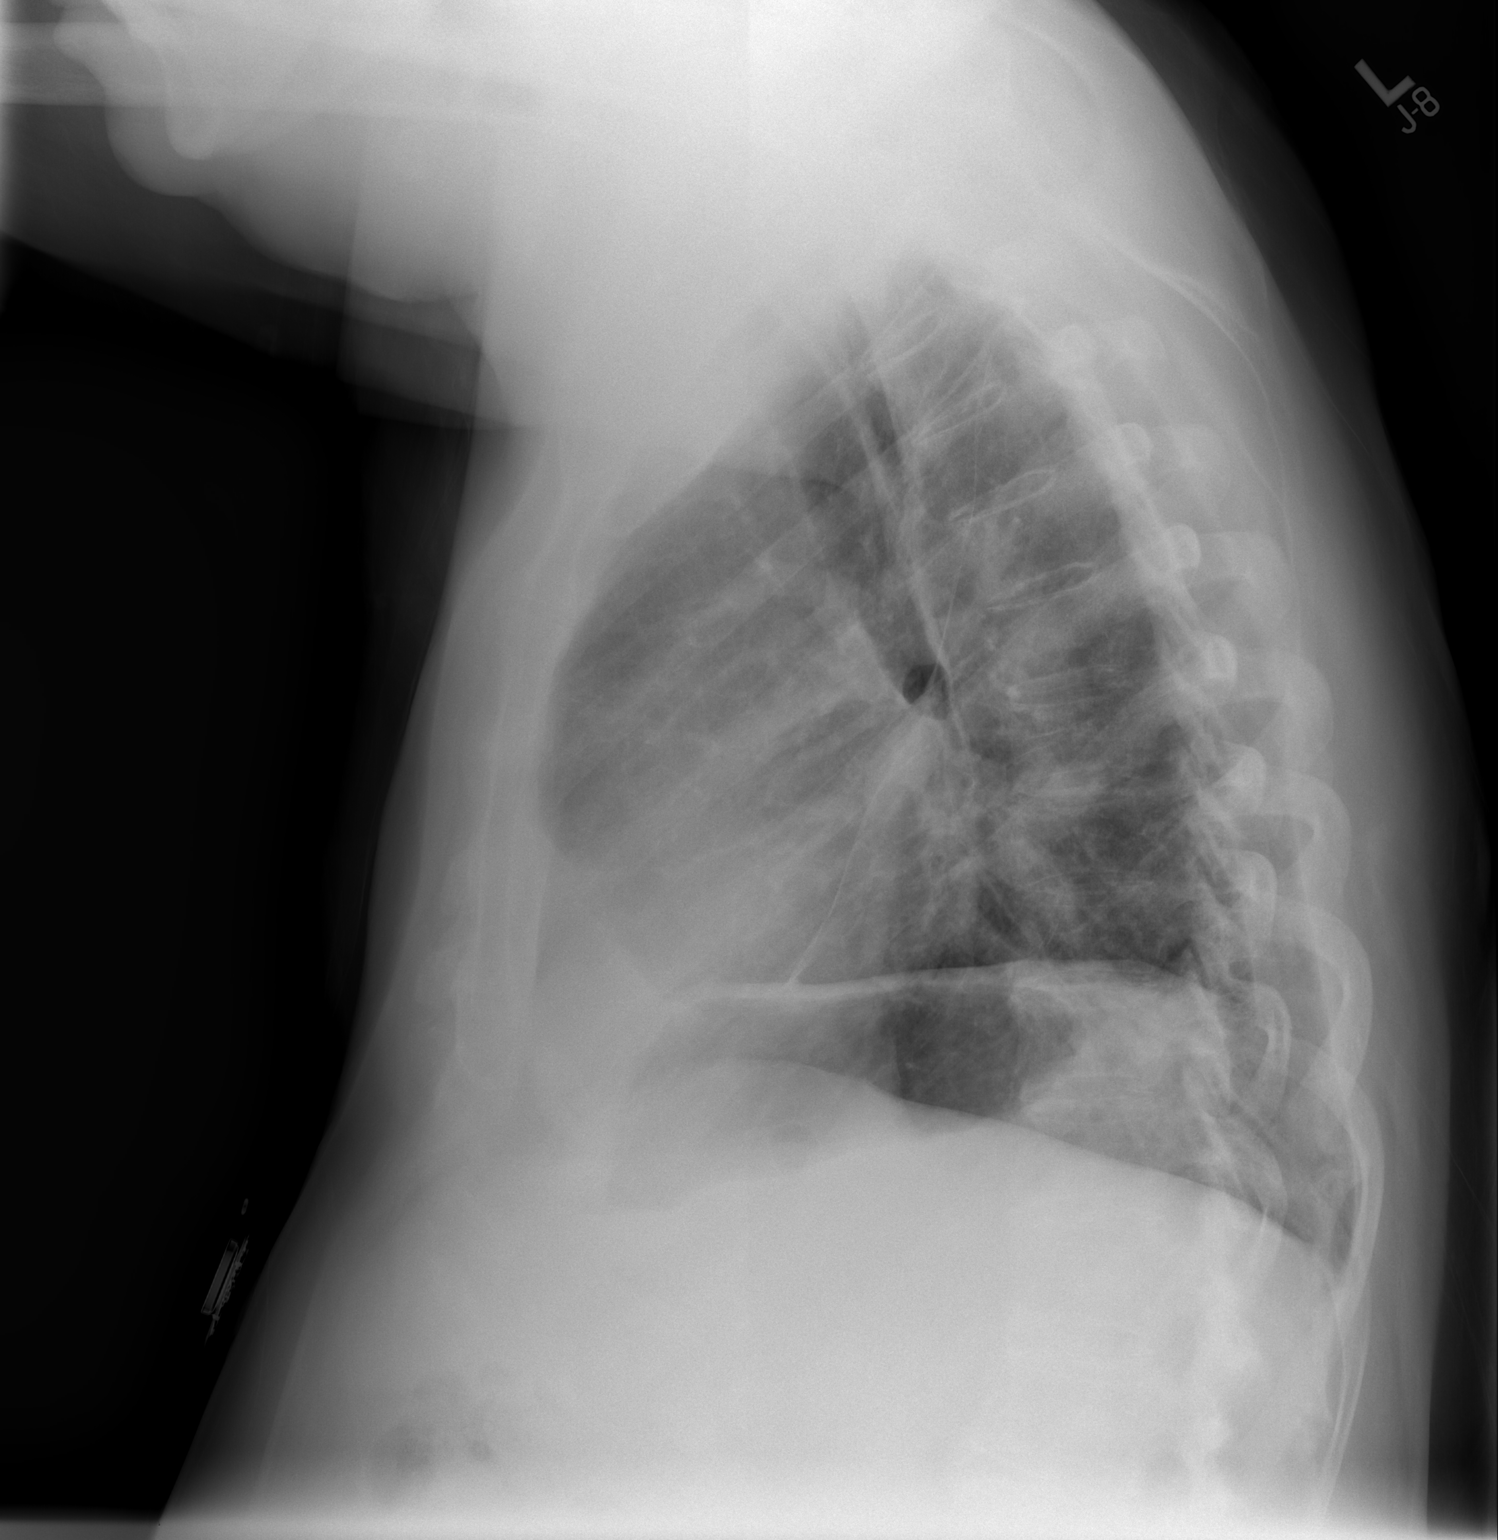

[2 of 2 positions shown; findings below may reference images not displayed]

FINDINGS: Left midlung zone density once again noted appearing
relatively similar to the prior exam. This would require CT imaging
for further delineation if clinically desired.

No new consolidation, mass or pneumothorax noted.  Pulmonary
vascular prominence most notable centrally.

Tortuous aorta.  Heart size top normal.
IMPRESSION: Relatively stable exam as detailed above.

## 2012-05-26 IMAGING — CR DG CHEST 1V PORT
1 series · 1 of 1 positions shown · non-contrast
Comparison: 07/25/2010 and 10/07/2009.

CLINICAL DATA: Severe shortness of breath.

PORTABLE CHEST - 1 VIEW

[AP]
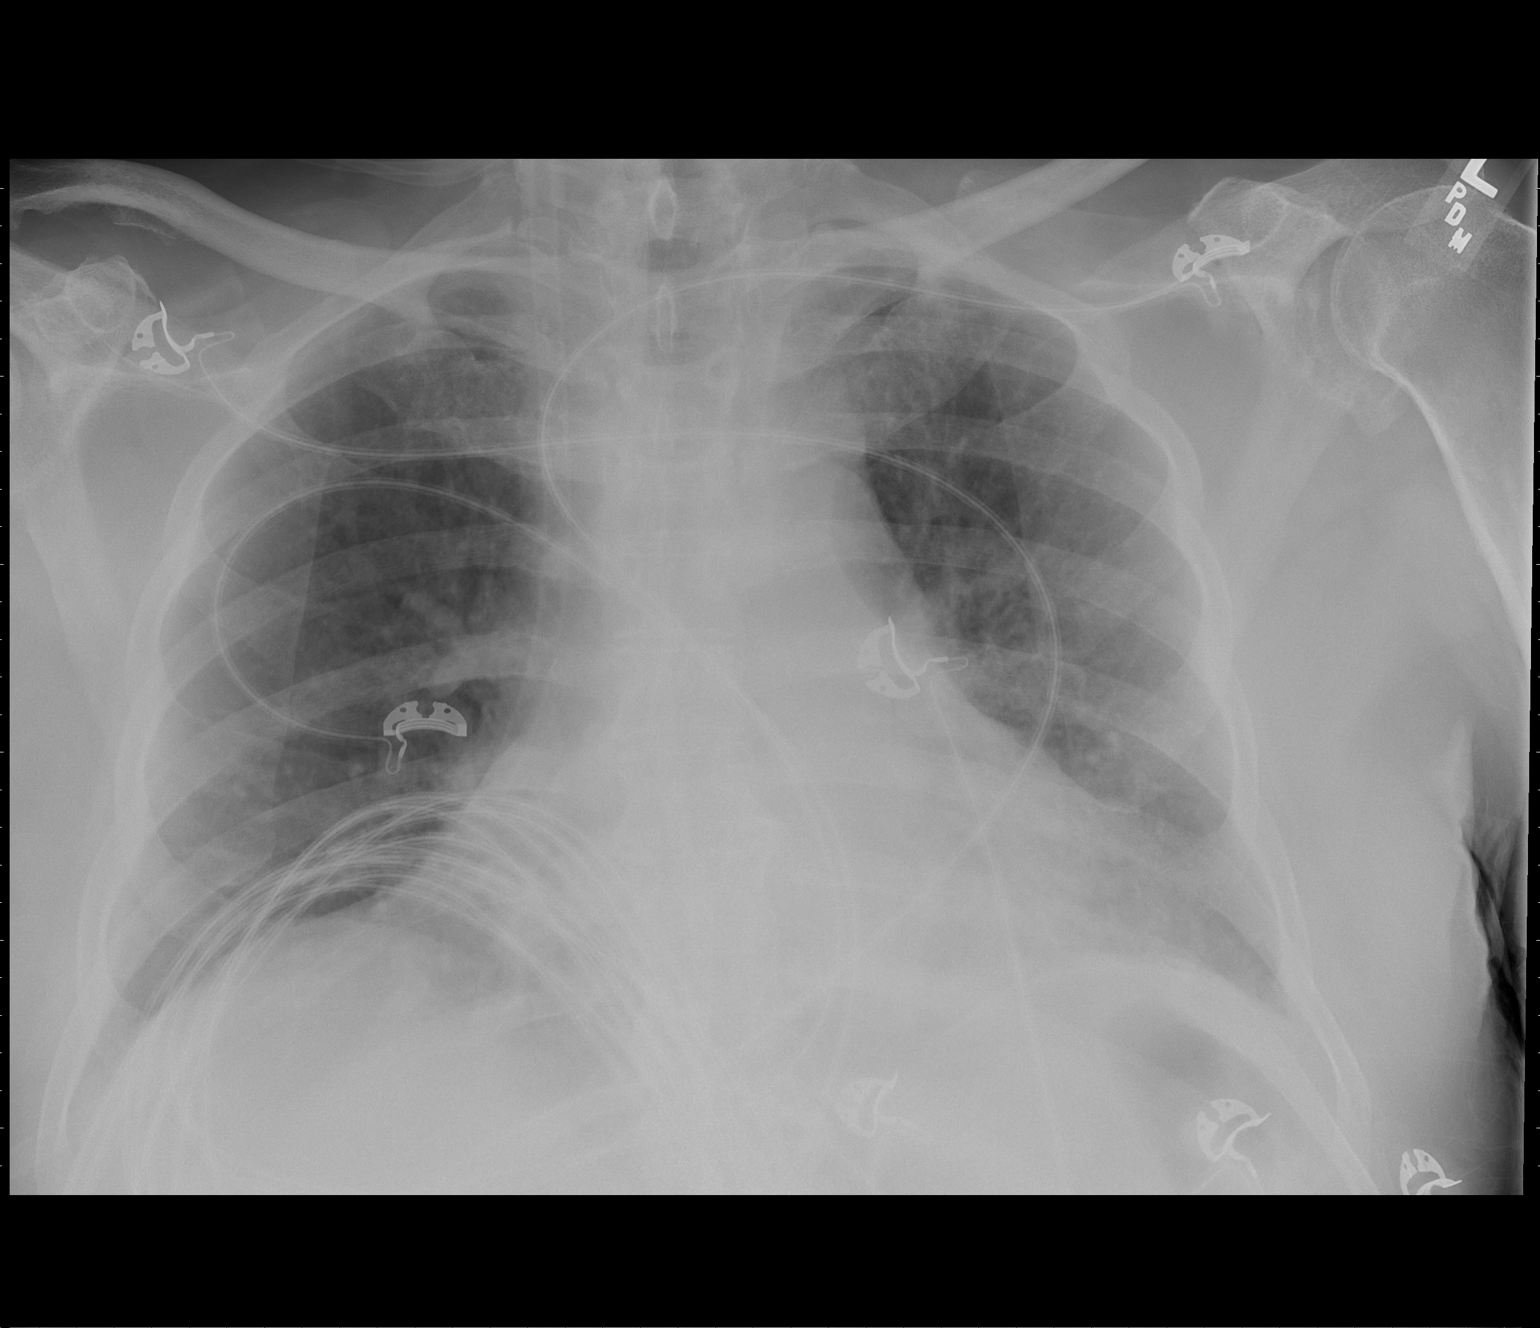

[1 of 1 positions shown; findings below may reference images not displayed]

FINDINGS: There are low lung volumes with increased basilar
atelectasis.  There is stable vascular congestion and cardiomegaly.
No overt pulmonary edema, confluent airspace opacity or significant
pleural effusion is identified.  Multiple telemetry leads overlie
the chest.
IMPRESSION: Worsened pulmonary aeration with increased bibasilar atelectasis.

## 2012-05-29 IMAGING — CR DG CHEST 1V PORT
1 series · 1 of 1 positions shown · non-contrast
Comparison: Portable chest x-ray of 08/01/2010

CLINICAL DATA: Congestive heart failure, pneumonia

PORTABLE CHEST - 1 VIEW

[AP]
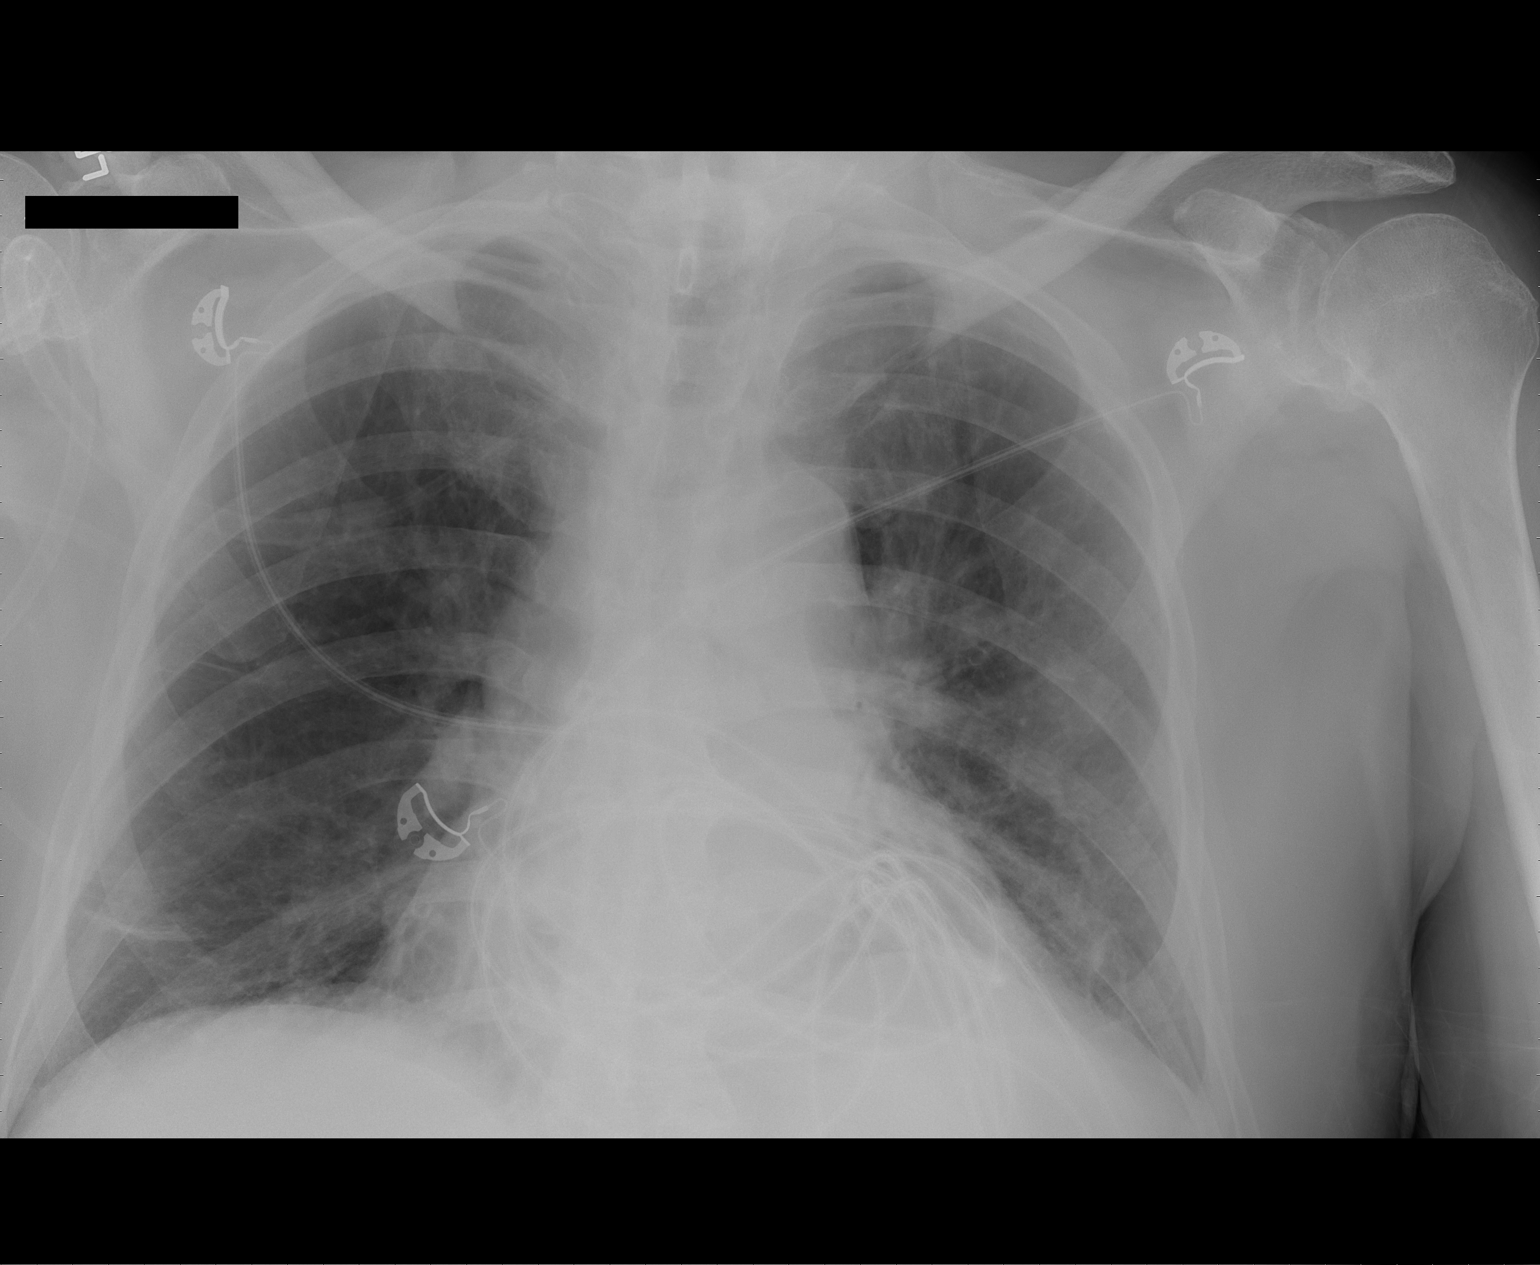

[1 of 1 positions shown; findings below may reference images not displayed]

FINDINGS: There is little change in mild bibasilar linear
atelectasis.  Cardiomegaly is stable.  No bony abnormality is seen.
IMPRESSION: No significant change in mild basilar atelectasis and cardiomegaly.

## 2012-05-30 IMAGING — NM NM PULMONARY VENT & PERF
2 series · 12 of 12 positions shown · non-contrast
Comparison: Chest radiograph 08/03/2010, CT 08/02/2010

CLINICAL DATA: Short of breath, concern for pulmonary embolism.
Acute wire pneumonia.

NUCLEAR MEDICINE VENTILATION - PERFUSION LUNG SCAN
TECHNIQUE: Wash-in, equilibrium, and wash-out phase ventilation
images were obtained using Te-2BB gas.  Perfusion images were
obtained in multiple projections after intravenous injection of Tc-
99m MAA.
Radiopharmaceuticals:  11.0 mCi Te-2BB gas and 6.6 mCi Vc-SSm MAA.

[vq scan · 2.52mm/px · 6 of 20 frames shown (1 of 2)]
[frame 2/20  full-range]
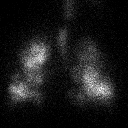
[frame 5/20  full-range]
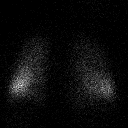
[frame 9/20  full-range]
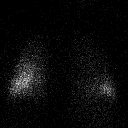
[frame 12/20  full-range]
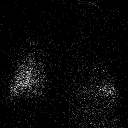
[frame 15/20]
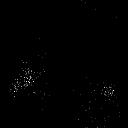
[frame 19/20]
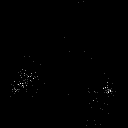

[vq scan · 2.52mm/px · 6 of 19 frames shown (2 of 2)]
[frame 2/19  full-range]
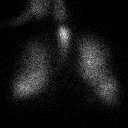
[frame 5/19  full-range]
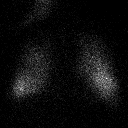
[frame 8/19  full-range]
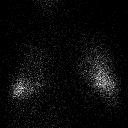
[frame 11/19  full-range]
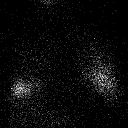
[frame 14/19  full-range]
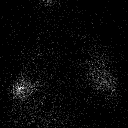
[frame 18/19  full-range]
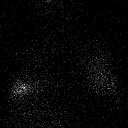

[12 of 12 positions shown; findings below may reference images not displayed]

FINDINGS: Ventilation:  There is a large ventilation defect within the
posterior lateral left lung.  There is a large ventilation defect
within the posterior lateral right lung.  There is air trapping at
the bilateral lung bases.

Perfusion:  There is a mild perfusion defect within the lateral
left lung which matches the perfusion and ventilation defect.
There is a left lower lobe perfusion defect which matches the
ventilation defect.  A small perfusion defect within the posterior
right upper lobe is not clearly matched.
IMPRESSION: Large matched perfusion defect within the left lower lobe
correlates with atelectasis versus infiltrate in the left lower
lobe on comparison CT.  While technically  this scores as
intermediate probability for  pulmonary embolism,   favor  low
probability for pulmonary embolism.

## 2012-06-27 ENCOUNTER — Other Ambulatory Visit: Payer: Self-pay | Admitting: *Deleted

## 2012-06-27 DIAGNOSIS — R351 Nocturia: Secondary | ICD-10-CM

## 2012-06-27 MED ORDER — TAMSULOSIN HCL 0.4 MG PO CAPS: 0.4000 mg | ORAL_CAPSULE | Freq: Every day | ORAL | Status: DC

## 2012-07-15 ENCOUNTER — Other Ambulatory Visit: Payer: Self-pay | Admitting: *Deleted

## 2012-07-15 DIAGNOSIS — R351 Nocturia: Secondary | ICD-10-CM

## 2012-07-15 MED ORDER — TAMSULOSIN HCL 0.4 MG PO CAPS: 0.4000 mg | ORAL_CAPSULE | Freq: Every day | ORAL | Status: DC

## 2012-07-17 ENCOUNTER — Other Ambulatory Visit: Payer: Self-pay | Admitting: *Deleted

## 2012-07-17 MED ORDER — METOPROLOL TARTRATE 25 MG PO TABS: 12.5000 mg | ORAL_TABLET | Freq: Two times a day (BID) | ORAL | Status: DC

## 2012-08-03 ENCOUNTER — Encounter (HOSPITAL_COMMUNITY): Payer: Self-pay

## 2012-08-03 ENCOUNTER — Emergency Department (HOSPITAL_COMMUNITY): Payer: Medicare Other

## 2012-08-03 ENCOUNTER — Inpatient Hospital Stay (HOSPITAL_COMMUNITY)
Admission: EM | Admit: 2012-08-03 | Discharge: 2012-08-09 | DRG: 177 | Disposition: A | Discharge status: Skilled Nursing Facility | Payer: Medicare Other | Attending: Internal Medicine | Admitting: Internal Medicine

## 2012-08-03 DIAGNOSIS — J449 Chronic obstructive pulmonary disease, unspecified: Secondary | ICD-10-CM | POA: Diagnosis present

## 2012-08-03 DIAGNOSIS — H3530 Unspecified macular degeneration: Secondary | ICD-10-CM | POA: Diagnosis present

## 2012-08-03 DIAGNOSIS — G4700 Insomnia, unspecified: Secondary | ICD-10-CM | POA: Diagnosis present

## 2012-08-03 DIAGNOSIS — J4489 Other specified chronic obstructive pulmonary disease: Secondary | ICD-10-CM | POA: Diagnosis present

## 2012-08-03 DIAGNOSIS — I83229 Varicose veins of left lower extremity with both ulcer of unspecified site and inflammation: Secondary | ICD-10-CM | POA: Diagnosis present

## 2012-08-03 DIAGNOSIS — L97919 Non-pressure chronic ulcer of unspecified part of right lower leg with unspecified severity: Secondary | ICD-10-CM | POA: Diagnosis present

## 2012-08-03 DIAGNOSIS — E872 Acidosis, unspecified: Secondary | ICD-10-CM | POA: Diagnosis present

## 2012-08-03 DIAGNOSIS — Z8546 Personal history of malignant neoplasm of prostate: Secondary | ICD-10-CM

## 2012-08-03 DIAGNOSIS — J9601 Acute respiratory failure with hypoxia: Secondary | ICD-10-CM | POA: Diagnosis present

## 2012-08-03 DIAGNOSIS — I83219 Varicose veins of right lower extremity with both ulcer of unspecified site and inflammation: Secondary | ICD-10-CM | POA: Diagnosis present

## 2012-08-03 DIAGNOSIS — N181 Chronic kidney disease, stage 1: Secondary | ICD-10-CM | POA: Diagnosis present

## 2012-08-03 DIAGNOSIS — I10 Essential (primary) hypertension: Secondary | ICD-10-CM | POA: Diagnosis present

## 2012-08-03 DIAGNOSIS — I83009 Varicose veins of unspecified lower extremity with ulcer of unspecified site: Secondary | ICD-10-CM | POA: Diagnosis present

## 2012-08-03 DIAGNOSIS — E871 Hypo-osmolality and hyponatremia: Secondary | ICD-10-CM | POA: Diagnosis present

## 2012-08-03 DIAGNOSIS — L97909 Non-pressure chronic ulcer of unspecified part of unspecified lower leg with unspecified severity: Secondary | ICD-10-CM | POA: Diagnosis present

## 2012-08-03 DIAGNOSIS — Z87898 Personal history of other specified conditions: Secondary | ICD-10-CM

## 2012-08-03 DIAGNOSIS — E785 Hyperlipidemia, unspecified: Secondary | ICD-10-CM | POA: Diagnosis present

## 2012-08-03 DIAGNOSIS — N179 Acute kidney failure, unspecified: Secondary | ICD-10-CM | POA: Diagnosis present

## 2012-08-03 DIAGNOSIS — H9190 Unspecified hearing loss, unspecified ear: Secondary | ICD-10-CM | POA: Diagnosis present

## 2012-08-03 DIAGNOSIS — G5690 Unspecified mononeuropathy of unspecified upper limb: Secondary | ICD-10-CM | POA: Diagnosis present

## 2012-08-03 DIAGNOSIS — J690 Pneumonitis due to inhalation of food and vomit: Principal | ICD-10-CM | POA: Diagnosis present

## 2012-08-03 DIAGNOSIS — J189 Pneumonia, unspecified organism: Secondary | ICD-10-CM | POA: Diagnosis present

## 2012-08-03 DIAGNOSIS — D649 Anemia, unspecified: Secondary | ICD-10-CM

## 2012-08-03 DIAGNOSIS — I129 Hypertensive chronic kidney disease with stage 1 through stage 4 chronic kidney disease, or unspecified chronic kidney disease: Secondary | ICD-10-CM | POA: Diagnosis present

## 2012-08-03 DIAGNOSIS — J9600 Acute respiratory failure, unspecified whether with hypoxia or hypercapnia: Secondary | ICD-10-CM | POA: Diagnosis present

## 2012-08-03 DIAGNOSIS — G609 Hereditary and idiopathic neuropathy, unspecified: Secondary | ICD-10-CM | POA: Diagnosis present

## 2012-08-03 DIAGNOSIS — G909 Disorder of the autonomic nervous system, unspecified: Secondary | ICD-10-CM

## 2012-08-03 DIAGNOSIS — R0902 Hypoxemia: Secondary | ICD-10-CM | POA: Diagnosis present

## 2012-08-03 HISTORY — DX: Pneumonia, unspecified organism: J18.9

## 2012-08-03 LAB — CBC WITH DIFFERENTIAL/PLATELET
Basophils Absolute: 0 10*3/uL (ref 0.0–0.1)
Eosinophils Absolute: 0 10*3/uL (ref 0.0–0.7)
HCT: 32 % — ABNORMAL LOW (ref 39.0–52.0)
Lymphocytes Relative: 7 % — ABNORMAL LOW (ref 12–46)
Lymphs Abs: 0.9 10*3/uL (ref 0.7–4.0)
MCHC: 34.1 g/dL (ref 30.0–36.0)
Neutro Abs: 11.2 10*3/uL — ABNORMAL HIGH (ref 1.7–7.7)
RDW: 13.7 % (ref 11.5–15.5)

## 2012-08-03 LAB — HIV ANTIBODY (ROUTINE TESTING W REFLEX): HIV: NONREACTIVE

## 2012-08-03 LAB — URINALYSIS, ROUTINE W REFLEX MICROSCOPIC
Glucose, UA: NEGATIVE mg/dL
Leukocytes, UA: NEGATIVE
Nitrite: NEGATIVE
Protein, ur: NEGATIVE mg/dL
pH: 5 (ref 5.0–8.0)

## 2012-08-03 LAB — URINE MICROSCOPIC-ADD ON

## 2012-08-03 LAB — COMPREHENSIVE METABOLIC PANEL
Albumin: 3.3 g/dL — ABNORMAL LOW (ref 3.5–5.2)
BUN: 20 mg/dL (ref 6–23)
Calcium: 8.9 mg/dL (ref 8.4–10.5)
Creatinine, Ser: 1.32 mg/dL (ref 0.50–1.35)
Potassium: 4.6 mEq/L (ref 3.5–5.1)
Total Protein: 6.5 g/dL (ref 6.0–8.3)

## 2012-08-03 LAB — TROPONIN I: Troponin I: <0.3 ng/mL (ref <0.30)

## 2012-08-03 LAB — LACTIC ACID, PLASMA: Lactic Acid, Venous: 1.4 mmol/L (ref 0.5–2.2)

## 2012-08-03 MED ORDER — SIMVASTATIN 40 MG PO TABS
40.0000 mg | ORAL_TABLET | Freq: Every evening | ORAL | Status: DC
  Administered 2012-08-03 – 2012-08-04 (×2): 40 mg via ORAL
  Filled 2012-08-03 (×3): qty 1

## 2012-08-03 MED ORDER — VANCOMYCIN HCL IN DEXTROSE 1-5 GM/200ML-% IV SOLN
1000.0000 mg | Freq: Once | INTRAVENOUS | Status: AC
  Administered 2012-08-03: 1000 mg via INTRAVENOUS
  Filled 2012-08-03: qty 200

## 2012-08-03 MED ORDER — LISINOPRIL 5 MG PO TABS
5.0000 mg | ORAL_TABLET | Freq: Every day | ORAL | Status: DC
  Administered 2012-08-03 – 2012-08-04 (×2): 5 mg via ORAL
  Filled 2012-08-03 (×3): qty 1

## 2012-08-03 MED ORDER — SODIUM CHLORIDE 0.9 % IV SOLN
INTRAVENOUS | Status: AC
  Administered 2012-08-03: 14:00:00 via INTRAVENOUS

## 2012-08-03 MED ORDER — VANCOMYCIN HCL IN DEXTROSE 1-5 GM/200ML-% IV SOLN
1000.0000 mg | INTRAVENOUS | Status: DC
  Administered 2012-08-04: 1000 mg via INTRAVENOUS
  Filled 2012-08-03 (×2): qty 200

## 2012-08-03 MED ORDER — METOPROLOL TARTRATE 12.5 MG HALF TABLET
12.5000 mg | ORAL_TABLET | Freq: Two times a day (BID) | ORAL | Status: DC
  Administered 2012-08-04 (×2): 12.5 mg via ORAL
  Filled 2012-08-03 (×5): qty 1

## 2012-08-03 MED ORDER — GABAPENTIN 300 MG PO CAPS
300.0000 mg | ORAL_CAPSULE | Freq: Two times a day (BID) | ORAL | Status: DC
  Administered 2012-08-03 – 2012-08-04 (×4): 300 mg via ORAL
  Filled 2012-08-03 (×6): qty 1

## 2012-08-03 MED ORDER — MELATONIN 3 MG PO CAPS: 3.0000 mg | ORAL_CAPSULE | Freq: Every day | ORAL | Status: DC

## 2012-08-03 MED ORDER — ALBUTEROL SULFATE (5 MG/ML) 0.5% IN NEBU: 2.5000 mg | INHALATION_SOLUTION | RESPIRATORY_TRACT | Status: AC | PRN

## 2012-08-03 MED ORDER — TEMAZEPAM 15 MG PO CAPS
30.0000 mg | ORAL_CAPSULE | Freq: Every day | ORAL | Status: DC
  Administered 2012-08-03 – 2012-08-04 (×2): 30 mg via ORAL
  Filled 2012-08-03 (×2): qty 2

## 2012-08-03 MED ORDER — GUAIFENESIN ER 600 MG PO TB12
1200.0000 mg | ORAL_TABLET | Freq: Two times a day (BID) | ORAL | Status: DC
  Administered 2012-08-03 – 2012-08-09 (×12): 1200 mg via ORAL
  Filled 2012-08-03 (×14): qty 2

## 2012-08-03 MED ORDER — IOHEXOL 350 MG/ML SOLN
100.0000 mL | Freq: Once | INTRAVENOUS | Status: AC | PRN
  Administered 2012-08-03: 100 mL via INTRAVENOUS

## 2012-08-03 MED ORDER — ONDANSETRON HCL 4 MG/2ML IJ SOLN: 4.0000 mg | Freq: Three times a day (TID) | INTRAMUSCULAR | Status: AC | PRN

## 2012-08-03 MED ORDER — ENSURE COMPLETE PO LIQD
237.0000 mL | Freq: Three times a day (TID) | ORAL | Status: DC
  Administered 2012-08-03 – 2012-08-04 (×4): 237 mL via ORAL

## 2012-08-03 MED ORDER — ENOXAPARIN SODIUM 40 MG/0.4ML ~~LOC~~ SOLN
40.0000 mg | SUBCUTANEOUS | Status: DC
  Administered 2012-08-03 – 2012-08-05 (×3): 40 mg via SUBCUTANEOUS
  Filled 2012-08-03 (×4): qty 0.4

## 2012-08-03 MED ORDER — LEVOFLOXACIN IN D5W 500 MG/100ML IV SOLN
500.0000 mg | INTRAVENOUS | Status: AC
  Administered 2012-08-05: 500 mg via INTRAVENOUS
  Filled 2012-08-03: qty 100

## 2012-08-03 MED ORDER — FUROSEMIDE 20 MG PO TABS
20.0000 mg | ORAL_TABLET | ORAL | Status: DC
  Filled 2012-08-03: qty 1

## 2012-08-03 MED ORDER — ACETAMINOPHEN 650 MG RE SUPP
650.0000 mg | Freq: Once | RECTAL | Status: AC
  Administered 2012-08-03: 650 mg via RECTAL
  Filled 2012-08-03: qty 1

## 2012-08-03 MED ORDER — LEVOFLOXACIN IN D5W 500 MG/100ML IV SOLN
500.0000 mg | Freq: Once | INTRAVENOUS | Status: AC
  Administered 2012-08-03: 500 mg via INTRAVENOUS
  Filled 2012-08-03: qty 100

## 2012-08-03 MED ORDER — ENSURE IMMUNE HEALTH PO LIQD: 237.0000 mL | Freq: Three times a day (TID) | ORAL | Status: DC

## 2012-08-03 MED ORDER — DEXTROSE 5 % IV SOLN
2.0000 g | INTRAVENOUS | Status: DC
  Administered 2012-08-03 – 2012-08-04 (×2): 2 g via INTRAVENOUS
  Filled 2012-08-03 (×3): qty 2

## 2012-08-03 MED ORDER — TAMSULOSIN HCL 0.4 MG PO CAPS
0.4000 mg | ORAL_CAPSULE | Freq: Every day | ORAL | Status: DC
  Administered 2012-08-03 – 2012-08-04 (×2): 0.4 mg via ORAL
  Filled 2012-08-03 (×3): qty 1

## 2012-08-03 NOTE — ED Notes (Signed)
Pt not able to ambulate. Pt needed two person assistance to stand to obtain weight and had trouble taking steps toward scale.

## 2012-08-03 NOTE — ED Notes (Signed)
Per EMS 158/80, 90, 24, 98%

## 2012-08-03 NOTE — ED Notes (Signed)
ZOX:[id number]<AV> Expected date:08/03/12<BR> Expected time: 8:39 AM<BR> Means of arrival:Ambulance<BR> Comments:<BR> Fever, elderly

## 2012-08-03 NOTE — H&P (Addendum)
[facility] History and Physical  Antonio Bradley ZOX:[id number] DOB: 1912/05/08 DOA: 08/03/2012   PCP: Marlowe Aschoff, FNP / Dr Redmond School   Chief Complaint: fever sob  HPI: Antonio Bradley is a 79 y.o. male wth h/o hypertension, copd, came in from the assisted living facility for fever, sob, cough and on arrival to ED, he underwent  A cxr and CT angio showing ground glass attenuation concerning for pneumonia.  His oxygen sats were upper 80's  On room air. He is being admitted to medical service for management of health care associated pneumonia. He is an elderly gentleman with hearing loss and vision loss from macular degeneration and most of the history was obtained from his daughter at bedside.   Review of Systems: could not be obtained frm the patient.   Past Medical History  Diagnosis Date  . ANEMIA, B12 DEFICIENCY 12/06/2006  . INSOMNIA, CHRONIC 05/22/2007  . PERIPHERAL AUTONOMIC NEUROPATHY D/O CLASS ELSW 10/27/2008  . HYPERTENSION 11/26/2006  . COPD 12/06/2006  . Slow transit constipation 05/22/2007  . OSTEOARTHROSIS, LOCAL NOS, LOWER LEG 12/12/2006  . Osteoarth NOS-Unspec 12/06/2006  . EDEMA LEG 09/28/2009  . MASS, LUNG 10/07/2009  . NON-HODGKIN'S LYMPHOMA 09/04/2007  . Prostate ca   . Non hodgkins lymphoma   . Pneumonia    Past Surgical History  Procedure Laterality Date  . Hemorrhoid surgery    .  skin cancers    . Cataract extraction, bilateral     Social History:  reports that he quit smoking about 62 years ago. He has never used smokeless tobacco. He reports that he does not drink alcohol or use illicit drugs.  where does patient live-- ALF, No Known Allergies  Family History  Problem Relation Age of Onset  . Pneumonia Mother   . Cancer Father   . Breast cancer Daughter   . Testicular cancer Son    Prior to Admission medications   Medication Sig Start Date End Date Taking? Authorizing Provider  acetaminophen (TYLENOL) 500 MG tablet Take 1,000 mg by mouth every 6 (six)  hours as needed. For pain.    Yes Historical Provider, MD  diphenhydramine-acetaminophen (TYLENOL PM) 25-500 MG TABS Take 1 tablet by mouth at bedtime as needed.   Yes Historical Provider, MD  feeding supplement (ENSURE IMMUNE HEALTH) LIQD Take 237 mLs by mouth 3 (three) times daily with meals.   Yes Historical Provider, MD  Folic Acid-Vit B6-Vit B12 (FOLBEE PO) Take 1 tablet by mouth daily.    Yes Historical Provider, MD  furosemide (LASIX) 20 MG tablet Take 20 mg by mouth every other day.    Yes Historical Provider, MD  gabapentin (NEURONTIN) 300 MG capsule Take 300 mg by mouth 2 (two) times daily.    Yes Historical Provider, MD  guaiFENesin (MUCINEX) 600 MG 12 hr tablet Take 1,200 mg by mouth 2 (two) times daily.   Yes Historical Provider, MD  lisinopril (PRINIVIL,ZESTRIL) 5 MG tablet Take 5 mg by mouth daily.   Yes Historical Provider, MD  magnesium hydroxide (MILK OF MAGNESIA) 400 MG/5ML suspension Take 30 mLs by mouth daily as needed. constipation   Yes Historical Provider, MD  Melatonin 3 MG CAPS Take 1 capsule (3 mg total) by mouth at bedtime. 06/05/11  Yes Stacie Glaze, MD  metoprolol tartrate (LOPRESSOR) 25 MG tablet Take 0.5 tablets (12.5 mg total) by mouth 2 (two) times daily. 07/17/12  Yes Stacie Glaze, MD  simvastatin (ZOCOR) 40 MG tablet Take 40 mg by mouth every  evening.   Yes Historical Provider, MD  Skin Protectants, Misc. (BAZA PROTECT EX) Apply 1 application topically 2 (two) times daily. Apply to buttocks   Yes Historical Provider, MD  tamsulosin (FLOMAX) 0.4 MG CAPS Take 1 capsule (0.4 mg total) by mouth daily. 07/15/12  Yes Stacie Glaze, MD  temazepam (RESTORIL) 30 MG capsule Take 30 mg by mouth at bedtime.   Yes Historical Provider, MD  Leuprolide Acetate, 6 Month, (LUPRON DEPOT) 45 MG injection Inject 45 mg into the muscle every 6 (six) months.    Historical Provider, MD   Physical Exam: Filed Vitals:   08/03/12 1134 08/03/12 1202 08/03/12 1327 08/03/12 1522  BP:   120/51 116/53 110/63  Pulse:  85 73 58  Temp: 100.1 F (37.8 C)   97.7 F (36.5 C)  TempSrc: Rectal   Oral  Resp:  20 20 20   Height:      Weight:      SpO2:  97% 100% 99%    Constitutional: Vital signs reviewed.  Patient is a well-developed and well-nourished  in no acute distress and cooperative with exam. Alert and oriented person and place only.  Head: Normocephalic and atraumatic Mouth: no erythema or exudates, dry MM Eyes: PERRL, EOMI, conjunctivae normal, No scleral icterus.  Neck: Supple, Trachea midline normal ROM, No JVD, mass, thyromegaly, or carotid bruit present.  Cardiovascular: RRR, S1 normal, S2 normal, no MRG, pulses symmetric and intact bilaterally Pulmonary/Chest: CTAB, no wheezes, rales, or rhonchi Abdominal: Soft. Non-tender, non-distended, bowel sounds are normal, no masses, organomegaly, or guarding present.  Musculoskeletal: no pedal edema. Chronic venous stasis changes over the lower extremities on the shins Neurological: no facial asymmetry, no speech impairment. Able to move all extremities.  Skin: Warm, dry and intact. No rash, cyanosis, or clubbing.      Labs on Admission:  Basic Metabolic Panel:  Recent Labs Lab 08/03/12 0938  NA 123*  K 4.6  CL 92*  CO2 22  GLUCOSE 125*  BUN 20  CREATININE 1.32  CALCIUM 8.9   Liver Function Tests:  Recent Labs Lab 08/03/12 0938  AST 24  ALT 20  ALKPHOS 60  BILITOT 0.3  PROT 6.5  ALBUMIN 3.3*   No results found for this basename: LIPASE, AMYLASE,  in the last 168 hours No results found for this basename: AMMONIA,  in the last 168 hours CBC:  Recent Labs Lab 08/03/12 0938  WBC 13.2*  NEUTROABS 11.2*  HGB 10.9*  HCT 32.0*  MCV 90.4  PLT 222   Cardiac Enzymes:  Recent Labs Lab 08/03/12 0938  TROPONINI <0.30    BNP (last 3 results) No results found for this basename: PROBNP,  in the last 8760 hours CBG: No results found for this basename: GLUCAP,  in the last 168  hours  Radiological Exams on Admission: Dg Chest 2 View  08/03/2012  *RADIOLOGY REPORT*  Clinical Data: Cough, fever.  CHEST - 2 VIEW  Comparison: 04/04/2012  Findings: Mild cardiac enlargement.  No pleural effusion or edema. Lung volumes are low.  No airspace consolidation.  IMPRESSION:  1.  No acute findings. 2.  Low lung volumes.   Original Report Authenticated By: Signa Kell, M.D.    Ct Angio Chest Pe W/cm &/or Wo Cm  08/03/2012  *RADIOLOGY REPORT*  Clinical Data: Hypoxia  CT ANGIOGRAPHY CHEST  Technique:  Multidetector CT imaging of the chest using the standard protocol during bolus administration of intravenous contrast. Multiplanar reconstructed images including MIPs were obtained and  reviewed to evaluate the vascular anatomy.  Contrast: OMNIPAQUE IOHEXOL 350 MG/ML SOLN  Comparison: 08/02/2010  Findings: There is no pleural effusion identified.  Mild pleural thickening is noted within the posterior lung bases bilaterally. There is a small amount of fluid tracking along the major fissure of the right lung and oblique fissure in the left lung.  There are patchy areas of atelectasis noted bilaterally.  Most notable in the left lower lobe.  Focal area of ground-glass attenuation is noted in the right upper lobe, image number 50/series 9.  Pulmonary nodule in the right upper lobe measures 6 mm, image 52/series 9.  This appears new from the previous exam. Biapical scar like densities noted.  The heart size appears normal.  There is a right paratracheal lymph node which measures 1.3 cm, image 35/series 6.  There is an additional right paratracheal lymph node which measures 1.1 cm, image 26/series 6.  Exam detail is slightly diminished due to respiratory motion artifact.  There is no saddle embolus identified within the main pulmonary artery.  There are no specific features identified to suggest a clinically relevant lobar or segmental pulmonary artery embolus.  Limited imaging through the upper abdomen  is unremarkable.  Review of the visualized osseous structures is unremarkable.  No worrisome lytic or sclerotic bone lesions.  IMPRESSION:  1. Nonspecific areas of ground-glass attenuation and atelectasis are noted in both lungs.  2.  No specific features to suggest acute pulmonary embolus. 3.  Pulmonary nodule in the right upper lobe measures 6 mm. If the patient is at high risk for bronchogenic carcinoma, follow-up chest CT at 6-12 months is recommended.  If the patient is at low risk for bronchogenic carcinoma, follow-up chest CT at 12 months is recommended.  This recommendation follows the consensus statement: Guidelines for Management of Small Pulmonary Nodules Detected on CT Scans: A Statement from the Fleischner Society as published in Radiology 2005; 237:395-400.  4. Borderline enlarged right paratracheal lymph nodes.  Attention on follow-up imaging is advised.   Original Report Authenticated By: Signa Kell, M.D.     EKG: old  RBBB  Assessment/Plan Active Problems:    1. Health Care Associated Pneumonia:  - admit to med surg. Start patient on HCAP antibiotic regimen.  - Blood cultures are done and pending.  - sputum cultures are pending.  - repeat labs in am. - wean off oxygen as tolerated.   2. Hyponatremia: probably related to dehydration.  - hydration with IV fluids at 75 ml/hr.  - repeat BMP in am.   3. Elevated d dimer: followed by a negative CT angio for PE  4. Hypertension; controlled.   5. COPD: stable. No wheezing heard on exam.   6. Varicosities of the lower extremities: stable  7. DVT prophylaxis.   Code Status: full code Family Communication: daughter atbedside Disposition Plan: possibly on Monday.    [facility] Pager [phone]  If 7PM-7AM, please contact night-coverage [email or web address] Password [facility] 08/03/2012, 3:23 PM

## 2012-08-03 NOTE — Progress Notes (Signed)
ANTIBIOTIC CONSULT NOTE - INITIAL  Pharmacy Consult for Cefepime Indication: HCAP  No Known Allergies  Patient Measurements: Height: 5' 6.14" (168 cm) (Documented 04/04/12) Weight: 195 lb 8 oz (88.678 kg) IBW/kg (Calculated) : 64.13 Adjusted Body Weight:   Vital Signs: Temp: 101.7 F (38.7 C) (04/05 0902) Temp src: Rectal (04/05 0902) BP: 153/61 mmHg (04/05 1009) Pulse Rate: 92 (04/05 1009) Intake/Output from previous day:   Intake/Output from this shift:    Labs:  Recent Labs  08/03/12 0938  WBC 13.2*  HGB 10.9*  PLT 222  CREATININE 1.32   Estimated Creatinine Clearance: 33.4 ml/min (by C-G formula based on Cr of 1.32). No results found for this basename: VANCOTROUGH, VANCOPEAK, VANCORANDOM, GENTTROUGH, GENTPEAK, GENTRANDOM, TOBRATROUGH, TOBRAPEAK, TOBRARND, AMIKACINPEAK, AMIKACINTROU, AMIKACIN,  in the last 72 hours   Microbiology: No results found for this or any previous visit (from the past 720 hour(s)).  Medical History: Past Medical History  Diagnosis Date  . ANEMIA, B12 DEFICIENCY 12/06/2006  . INSOMNIA, CHRONIC 05/22/2007  . PERIPHERAL AUTONOMIC NEUROPATHY D/O CLASS ELSW 10/27/2008  . HYPERTENSION 11/26/2006  . COPD 12/06/2006  . Slow transit constipation 05/22/2007  . OSTEOARTHROSIS, LOCAL NOS, LOWER LEG 12/12/2006  . Osteoarth NOS-Unspec 12/06/2006  . EDEMA LEG 09/28/2009  . MASS, LUNG 10/07/2009  . NON-HODGKIN'S LYMPHOMA 09/04/2007  . Prostate ca   . Non hodgkins lymphoma   . Pneumonia     Medications:  Scheduled:  . [COMPLETED] acetaminophen  650 mg Rectal Once   Infusions:  . levofloxacin (LEVAQUIN) IV    . vancomycin     PRN:  Assessment: 79 yo M nursing home resident admitted from ER. History of 1 day of cough, body aches, SOB, chills and fever.  Goal of Therapy:  eradication of infection  Plan: 1. Begin Cefepime 2 Gm IV q 24 hours, based on renal function. 2. Follow up on cultures.  Loletta Specter 08/03/2012,11:06 AM

## 2012-08-03 NOTE — ED Notes (Signed)
MD at bedside. 

## 2012-08-03 NOTE — Progress Notes (Signed)
ANTIBIOTIC CONSULT NOTE - FOLLOW UP   Pharmacy Consult for Vancomycin, Levaquin, Cefepime Indication: PNA  No Known Allergies  Patient Measurements: Height: 5' 6.14" (168 cm) (Documented 04/04/12) Weight: 195 lb 8 oz (88.678 kg) IBW/kg (Calculated) : 64.13 Adjusted Body Weight:   Vital Signs: Temp: 100.1 F (37.8 C) (04/05 1134) Temp src: Rectal (04/05 1134) BP: 116/53 mmHg (04/05 1327) Pulse Rate: 73 (04/05 1327) Intake/Output from previous day:   Intake/Output from this shift:    Labs:  Recent Labs  08/03/12 0938  WBC 13.2*  HGB 10.9*  PLT 222  CREATININE 1.32   Estimated Creatinine Clearance: 33.4 ml/min (by C-G formula based on Cr of 1.32). No results found for this basename: VANCOTROUGH, VANCOPEAK, VANCORANDOM, GENTTROUGH, GENTPEAK, GENTRANDOM, TOBRATROUGH, TOBRAPEAK, TOBRARND, AMIKACINPEAK, AMIKACINTROU, AMIKACIN,  in the last 72 hours   Microbiology: No results found for this or any previous visit (from the past 720 hour(s)).  Anti-infectives   Start     Dose/Rate Route Frequency Ordered Stop   08/03/12 1200  ceFEPIme (MAXIPIME) 2 g in dextrose 5 % 50 mL IVPB     2 g 100 mL/hr over 30 Minutes Intravenous Every 24 hours 08/03/12 1112     08/03/12 1100  vancomycin (VANCOCIN) IVPB 1000 mg/200 mL premix     1,000 mg 200 mL/hr over 60 Minutes Intravenous  Once 08/03/12 1019 08/03/12 1251   08/03/12 1030  levofloxacin (LEVAQUIN) IVPB 500 mg     500 mg 100 mL/hr over 60 Minutes Intravenous  Once 08/03/12 1019 08/03/12 1351     Assessment: 89 yoF to start IV abx for suspected PNA. Hx COPD, PNA.    D#1 Antibiotics 4/5 >> Cefepime >> 4/5 >> Vancomycin >>  4/5 >> Levaquin >>  Tmax: 101.7 WBCs: Elevated @ 13.2 Renal: SCr 1.32, CrCl ~33 (N 32) Weight = 88.7kg, Height = 5'6"  4/5 blood: Collected  Goal of Therapy:  Vancomycin trough level 15-20 mcg/ml  Plan:  1.  Vancomycin 1000mg  IV q 24 hours (x 8 days) 2.  Levaquin 500mg  IV q 48 hours (x 3  days) 3.  Continue cefepime at 2gm IV q 8 hours 4.  F/u clinical course, VT at steady state  Haynes Hoehn, PharmD 08/03/2012 2:03 PM  Pager: [phone]

## 2012-08-03 NOTE — Progress Notes (Signed)

## 2012-08-03 NOTE — ED Provider Notes (Signed)
History     CSN: [id number]  Arrival date & time 08/03/12  4098   First MD Initiated Contact with Patient 08/03/12 [phone]      Chief Complaint  Patient presents with  . Cough  . Fever    (Consider location/radiation/quality/duration/timing/severity/associated sxs/prior treatment) HPI Comments: From nursing home with a one-day history of cough, body aches, shortness of breath, chills and fever. Patient states he felt well when he went to bed last night but was too weak to get out of bed this morning. He had to be helped to the bathroom. Denies chest pain, abdominal pain, nausea, vomiting. Denies any leg pain or swelling. Denies any change in bowel or bladder habits. Placed on oxygen for EMS but does not usually wear oxygen at home.  The history is provided by the patient, the EMS personnel and the nursing home.    Past Medical History  Diagnosis Date  . ANEMIA, B12 DEFICIENCY 12/06/2006  . INSOMNIA, CHRONIC 05/22/2007  . PERIPHERAL AUTONOMIC NEUROPATHY D/O CLASS ELSW 10/27/2008  . HYPERTENSION 11/26/2006  . COPD 12/06/2006  . Slow transit constipation 05/22/2007  . OSTEOARTHROSIS, LOCAL NOS, LOWER LEG 12/12/2006  . Osteoarth NOS-Unspec 12/06/2006  . EDEMA LEG 09/28/2009  . MASS, LUNG 10/07/2009  . NON-HODGKIN'S LYMPHOMA 09/04/2007  . Prostate ca   . Non hodgkins lymphoma   . Pneumonia     Past Surgical History  Procedure Laterality Date  . Hemorrhoid surgery    .  skin cancers    . Cataract extraction, bilateral      Family History  Problem Relation Age of Onset  . Pneumonia Mother   . Cancer Father   . Breast cancer Daughter   . Testicular cancer Son     History  Substance Use Topics  . Smoking status: Former Smoker    Quit date: 05/01/1950  . Smokeless tobacco: Never Used  . Alcohol Use: No      Review of Systems  Constitutional: Positive for fever, chills, activity change and appetite change.  HENT: Positive for congestion and rhinorrhea.   Respiratory: Positive for  cough. Negative for chest tightness.   Cardiovascular: Negative for chest pain and leg swelling.  Gastrointestinal: Negative for nausea, vomiting and abdominal pain.  Genitourinary: Negative for dysuria and hematuria.  Musculoskeletal: Negative for back pain.  Neurological: Positive for weakness. Negative for headaches.  A complete 10 system review of systems was obtained and all systems are negative except as noted in the HPI and PMH.    Allergies  Review of patient's allergies indicates no known allergies.  Home Medications   Current Outpatient Rx  Name  Route  Sig  Dispense  Refill  . acetaminophen (TYLENOL) 500 MG tablet   Oral   Take 1,000 mg by mouth every 6 (six) hours as needed. For pain.          . diphenhydramine-acetaminophen (TYLENOL PM) 25-500 MG TABS   Oral   Take 1 tablet by mouth at bedtime as needed.         . feeding supplement (ENSURE IMMUNE HEALTH) LIQD   Oral   Take 237 mLs by mouth 3 (three) times daily with meals.         . Folic Acid-Vit B6-Vit B12 (FOLBEE PO)   Oral   Take 1 tablet by mouth daily.          . furosemide (LASIX) 20 MG tablet   Oral   Take 20 mg by mouth every other day.          Marland Kitchen  gabapentin (NEURONTIN) 300 MG capsule   Oral   Take 300 mg by mouth 2 (two) times daily.          Marland Kitchen guaiFENesin (MUCINEX) 600 MG 12 hr tablet   Oral   Take 1,200 mg by mouth 2 (two) times daily.         Marland Kitchen lisinopril (PRINIVIL,ZESTRIL) 5 MG tablet   Oral   Take 5 mg by mouth daily.         . magnesium hydroxide (MILK OF MAGNESIA) 400 MG/5ML suspension   Oral   Take 30 mLs by mouth daily as needed. constipation         . Melatonin 3 MG CAPS   Oral   Take 1 capsule (3 mg total) by mouth at bedtime.   30 capsule   6   . metoprolol tartrate (LOPRESSOR) 25 MG tablet   Oral   Take 0.5 tablets (12.5 mg total) by mouth 2 (two) times daily.   180 tablet   0   . simvastatin (ZOCOR) 40 MG tablet   Oral   Take 40 mg by mouth every  evening.         . Skin Protectants, Misc. (BAZA PROTECT EX)   Apply externally   Apply 1 application topically 2 (two) times daily. Apply to buttocks         . tamsulosin (FLOMAX) 0.4 MG CAPS   Oral   Take 1 capsule (0.4 mg total) by mouth daily.   30 capsule   0     NEEDS OV   . temazepam (RESTORIL) 30 MG capsule   Oral   Take 30 mg by mouth at bedtime.         Marland Kitchen Leuprolide Acetate, 6 Month, (LUPRON DEPOT) 45 MG injection   Intramuscular   Inject 45 mg into the muscle every 6 (six) months.           BP 116/53  Pulse 73  Temp(Src) 100.1 F (37.8 C) (Rectal)  Resp 20  Ht 5' 6.14" (1.68 m)  Wt 195 lb 8 oz (88.678 kg)  BMI 31.42 kg/m2  SpO2 100%  Physical Exam  Constitutional: He is oriented to person, place, and time. He appears well-developed and well-nourished. No distress.  HENT:  Head: Normocephalic and atraumatic.  Mouth/Throat: Oropharynx is clear and moist. No oropharyngeal exudate.  Eyes: Conjunctivae and EOM are normal. Pupils are equal, round, and reactive to light.  Neck: Normal range of motion. Neck supple.  Cardiovascular: Normal rate, regular rhythm and normal heart sounds.   No murmur heard. Pulmonary/Chest: Effort normal and breath sounds normal. No respiratory distress.  Decreased breath sounds bilaterally  Abdominal: Soft. There is no tenderness. There is no rebound and no guarding.  Musculoskeletal: Normal range of motion. He exhibits no edema and no tenderness.  Neurological: He is alert and oriented to person, place, and time. No cranial nerve deficit. He exhibits normal muscle tone. Coordination normal.  Skin: Skin is warm.    ED Course  Procedures (including critical care time)  Labs Reviewed  CBC WITH DIFFERENTIAL - Abnormal; Notable for the following:    WBC 13.2 (*)    RBC 3.54 (*)    Hemoglobin 10.9 (*)    HCT 32.0 (*)    Neutrophils Relative 85 (*)    Lymphocytes Relative 7 (*)    Neutro Abs 11.2 (*)    Monocytes  Absolute 1.1 (*)    All other components within normal limits  COMPREHENSIVE  METABOLIC PANEL - Abnormal; Notable for the following:    Sodium 123 (*)    Chloride 92 (*)    Glucose, Bld 125 (*)    Albumin 3.3 (*)    GFR calc non Af Amer 43 (*)    GFR calc Af Amer 50 (*)    All other components within normal limits  URINALYSIS, ROUTINE W REFLEX MICROSCOPIC - Abnormal; Notable for the following:    Hgb urine dipstick LARGE (*)    All other components within normal limits  D-DIMER, QUANTITATIVE - Abnormal; Notable for the following:    D-Dimer, Quant 1.60 (*)    All other components within normal limits  CULTURE, BLOOD (ROUTINE X 2)  CULTURE, BLOOD (ROUTINE X 2)  LACTIC ACID, PLASMA  TROPONIN I  URINE MICROSCOPIC-ADD ON   Dg Chest 2 View  08/03/2012  *RADIOLOGY REPORT*  Clinical Data: Cough, fever.  CHEST - 2 VIEW  Comparison: 04/04/2012  Findings: Mild cardiac enlargement.  No pleural effusion or edema. Lung volumes are low.  No airspace consolidation.  IMPRESSION:  1.  No acute findings. 2.  Low lung volumes.   Original Report Authenticated By: Signa Kell, M.D.    Ct Angio Chest Pe W/cm &/or Wo Cm  08/03/2012  *RADIOLOGY REPORT*  Clinical Data: Hypoxia  CT ANGIOGRAPHY CHEST  Technique:  Multidetector CT imaging of the chest using the standard protocol during bolus administration of intravenous contrast. Multiplanar reconstructed images including MIPs were obtained and reviewed to evaluate the vascular anatomy.  Contrast: OMNIPAQUE IOHEXOL 350 MG/ML SOLN  Comparison: 08/02/2010  Findings: There is no pleural effusion identified.  Mild pleural thickening is noted within the posterior lung bases bilaterally. There is a small amount of fluid tracking along the major fissure of the right lung and oblique fissure in the left lung.  There are patchy areas of atelectasis noted bilaterally.  Most notable in the left lower lobe.  Focal area of ground-glass attenuation is noted in the right upper  lobe, image number 50/series 9.  Pulmonary nodule in the right upper lobe measures 6 mm, image 52/series 9.  This appears new from the previous exam. Biapical scar like densities noted.  The heart size appears normal.  There is a right paratracheal lymph node which measures 1.3 cm, image 35/series 6.  There is an additional right paratracheal lymph node which measures 1.1 cm, image 26/series 6.  Exam detail is slightly diminished due to respiratory motion artifact.  There is no saddle embolus identified within the main pulmonary artery.  There are no specific features identified to suggest a clinically relevant lobar or segmental pulmonary artery embolus.  Limited imaging through the upper abdomen is unremarkable.  Review of the visualized osseous structures is unremarkable.  No worrisome lytic or sclerotic bone lesions.  IMPRESSION:  1. Nonspecific areas of ground-glass attenuation and atelectasis are noted in both lungs.  2.  No specific features to suggest acute pulmonary embolus. 3.  Pulmonary nodule in the right upper lobe measures 6 mm. If the patient is at high risk for bronchogenic carcinoma, follow-up chest CT at 6-12 months is recommended.  If the patient is at low risk for bronchogenic carcinoma, follow-up chest CT at 12 months is recommended.  This recommendation follows the consensus statement: Guidelines for Management of Small Pulmonary Nodules Detected on CT Scans: A Statement from the Fleischner Society as published in Radiology 2005; 237:395-400.  4. Borderline enlarged right paratracheal lymph nodes.  Attention on follow-up imaging is  advised.   Original Report Authenticated By: Signa Kell, M.D.      1. HCAP (healthcare-associated pneumonia)   2. Hypoxia       MDM  1 day history of cough, SOB, fever, weakness, body aches.  No chest pain.  Vitals stable, febrile, O2 needing supplementation.  No obvious pneumonia on chest x-ray. However patient has productive cough, fever and  leukocytosis with hypoxia. We'll treat for HCAP.  CT scan shows no evidence of pulmonary emboli. He does have groundglass opacities in both lungs. He is stable on 2 L of O2 saturating 100%. Admission discussed with Dr. Blake Divine.   Date: 08/03/2012  Rate: 97  Rhythm: normal sinus rhythm  QRS Axis: normal  Intervals: normal  ST/T Wave abnormalities: nonspecific ST/T changes  Conduction Disutrbances:right bundle branch block  Narrative Interpretation:   Old EKG Reviewed: unchanged       Glynn Octave, MD 08/03/12 1428

## 2012-08-03 NOTE — ED Notes (Signed)
Patient transported to CT 

## 2012-08-03 NOTE — ED Notes (Signed)
Per EMS patient from [facility]. Staff state patient has been having a cough for the past 2 days. Productive cough-brown phlegm. This morning patient's temp was 100.6. Has Hx of pneumonia, COPD. O2 sat 93% on room air, on 2LNC 98%.

## 2012-08-03 NOTE — ED Notes (Signed)
Pt aware of the need for a urine sample. Pt unable to void at this time. 

## 2012-08-04 LAB — BASIC METABOLIC PANEL
BUN: 17 mg/dL (ref 6–23)
Calcium: 8.7 mg/dL (ref 8.4–10.5)
GFR calc Af Amer: 49 mL/min — ABNORMAL LOW (ref >90)
GFR calc non Af Amer: 43 mL/min — ABNORMAL LOW (ref >90)
Potassium: 4.4 mEq/L (ref 3.5–5.1)

## 2012-08-04 LAB — OSMOLALITY: Osmolality: 278 mOsm/kg (ref 275–300)

## 2012-08-04 LAB — CBC
Hemoglobin: 10.3 g/dL — ABNORMAL LOW (ref 13.0–17.0)
MCHC: 33.9 g/dL (ref 30.0–36.0)
Platelets: 202 10*3/uL (ref 150–400)
RDW: 14 % (ref 11.5–15.5)

## 2012-08-04 LAB — LEGIONELLA ANTIGEN, URINE: Legionella Antigen, Urine: NEGATIVE

## 2012-08-04 LAB — EXPECTORATED SPUTUM ASSESSMENT W GRAM STAIN, RFLX TO RESP C

## 2012-08-04 MED ORDER — BENZONATATE 100 MG PO CAPS
100.0000 mg | ORAL_CAPSULE | Freq: Once | ORAL | Status: AC
  Administered 2012-08-04: 100 mg via ORAL
  Filled 2012-08-04: qty 1

## 2012-08-04 NOTE — Progress Notes (Signed)
PROGRESS NOTE  Antonio Bradley LKG:[id number] DOB: Sep 17, 1912 DOA: 08/03/2012 PCP: Marlowe Aschoff, FNP  Brief narrative: 79 yr old male admitted 4.5.14 from [facility] with cough, phlegm and temp 100.6 and found to have a PNA  Past medical history-As per Problem list Chart reviewed as below-  Admission 12.5.13 for HCAP, C Diff colitis  Sees vascular Dr. Arbie Cookey for pre-tibial ulceraton  Sees Dr. Truett Perna q 6 mo for Non-Hodgkins-last OV 08/2011   Multiple ED visits for FLu, fall c head laceration etc.etc.  Admission 5.14.09 for Bilat leg weakness-?2/2 to deconditioning and steroid myopathy  Consultants:  none  Procedures:  CXR 4.5.141. No acute findings. 2. Low lung volumes.  CT Angio 4.5.14 1. Nonspecific areas ofground-glass attenuation and atelectasis are noted in both lungs. 2. No specific features to suggest acute pulmonary embolus. 3. Pulmonary nodule in the right upper lobe measures 6 mm. 4. Borderline enlarged right paratracheal lymph nodes. Attention on follow-up imaging is advised.  Antibiotics:  Cefepime 4.5.  Levaquin  4.5  Vancomycin 4.5   Subjective  Pleasant oriented.  States couldn't sleep last night.  Tolerating po well.  Cough still ++ with sptuum-reported as brown per RN. No CP SOB.   Objective    Objective: Filed Vitals:   08/03/12 1327 08/03/12 1522 08/03/12 2200 08/04/12 0600  BP: 116/53 110/63 123/56 145/60  Pulse: 73 58 62 98  Temp:  97.7 F (36.5 C) 97.8 F (36.6 C) 98.3 F (36.8 C)  TempSrc:  Oral Oral Oral  Resp: 20 20 18 20   Height:      Weight:      SpO2: 100% 99% 96% 94%    Intake/Output Summary (Last 24 hours) at 08/04/12 0925 Last data filed at 08/04/12 0924  Gross per 24 hour  Intake 618.75 ml  Output   1920 ml  Net -1301.25 ml    Exam:  General: Alert.  Slightly HOH.  No apparent distress.  Audible crackles.  ?Melanoma under L eye Cardiovascular: s1 s2 ? M R side chest Respiratory:  Audible crackles heard  throughout the chest.  No wheeze. Abdomen: soft NT/ND no rebound or gaurding Skin: Dressing to RLE Neuro no focal deficit noted  Data Reviewed: Basic Metabolic Panel:  Recent Labs Lab 08/03/12 0938 08/04/12 0528  NA 123* 127*  K 4.6 4.4  CL 92* 97  CO2 22 23  GLUCOSE 125* 120*  BUN 20 17  CREATININE 1.32 1.34  CALCIUM 8.9 8.7   Liver Function Tests:  Recent Labs Lab 08/03/12 0938  AST 24  ALT 20  ALKPHOS 60  BILITOT 0.3  PROT 6.5  ALBUMIN 3.3*   No results found for this basename: LIPASE, AMYLASE,  in the last 168 hours No results found for this basename: AMMONIA,  in the last 168 hours CBC:  Recent Labs Lab 08/03/12 0938 08/04/12 0528  WBC 13.2* 8.9  NEUTROABS 11.2*  --   HGB 10.9* 10.3*  HCT 32.0* 30.4*  MCV 90.4 91.8  PLT 222 202   Cardiac Enzymes:  Recent Labs Lab 08/03/12 0938  TROPONINI <0.30   BNP: No components found with this basename: POCBNP,  CBG: No results found for this basename: GLUCAP,  in the last 168 hours  Recent Results (from the past 240 hour(s))  CULTURE, EXPECTORATED SPUTUM-ASSESSMENT     Status: None   Collection Time    08/04/12  8:45 AM      Result Value Range Status   Specimen Description SPUTUM   Final  Special Requests NONE   Final   Sputum evaluation     Final   Value: THIS SPECIMEN IS ACCEPTABLE. RESPIRATORY CULTURE REPORT TO FOLLOW.   Report Status 08/04/2012 FINAL   Final     Studies:              All Imaging reviewed and is as per above notation   Scheduled Meds: . ceFEPime (MAXIPIME) IV  2 g Intravenous Q24H  . enoxaparin (LOVENOX) injection  40 mg Subcutaneous Q24H  . feeding supplement  237 mL Oral TID WC  . gabapentin  300 mg Oral BID  . guaiFENesin  1,200 mg Oral BID  . [START ON 08/05/2012] levofloxacin (LEVAQUIN) IV  500 mg Intravenous Q48H  . lisinopril  5 mg Oral Daily  . metoprolol tartrate  12.5 mg Oral BID  . simvastatin  40 mg Oral QPM  . tamsulosin  0.4 mg Oral Daily  . temazepam  30  mg Oral QHS  . vancomycin  1,000 mg Intravenous Q24H   Continuous Infusions:    Assessment /Plan: 1. HCAP vs Potentital aspiration PNA-Continue Broad spectrum Abx Cefepime/levaquin/vanco-strep Pneumo neg, Legionellea P.  Blood cultures P.  Given wetness to voice , will get speech therapy to evaluate for aspiration-wean oxygen with desat cscreen 2. 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 Prior h/o C diff-will attempt to minimize duration abx 5. Htn-Mod controlled only.  Continue Lisinopril 5 and Metoprolol 12.5 bid  Add another agent if persisiting above 160/90 6. H/o Pre-tibial ulcer.  Will review in am 7. Insomnia-Continue Restoril 30 qhs-attempt to wean as an out-patient 8. ?BPH-continue Flomax 0.4 daily 9. HLd-continue Zocor 40 qhs  Code Status: Full Family Communication: None at bedside-patient doesn;t wish me to update anyone currently Disposition Plan: inpatient-PT/OT to consult-probably needs return to SNF on d/c   Pleas Koch, MD  [facility] Pager [phone] 08/04/2012, 9:25 AM    LOS: 1 day

## 2012-08-04 NOTE — Evaluation (Signed)
Physical Therapy Evaluation Patient Details Name: Antonio Bradley MRN: [id number] DOB: [birth year] Today's Date: 08/04/2012 Time: 4098-1191 PT Time Calculation (min): 16 min  PT Assessment / Plan / Recommendation Clinical Impression  79 y.o. male admitted from ALF with PNA. At baseline pt ambulated with 4 wheeled RW and had assist for bathing/dressing. Now requires +2 total assist for bed to chair transfer. He would benefit from acute PT to maximize safety and independence with mobility. 24* assist at SNF level of care recommended.     PT Assessment  Patient needs continued PT services    Follow Up Recommendations  SNF;Supervision/Assistance - 24 hour    Does the patient have the potential to tolerate intense rehabilitation      Barriers to Discharge        Equipment Recommendations  None recommended by PT    Recommendations for Other Services OT consult   Frequency Min 3X/week    Precautions / Restrictions Precautions Precautions: Fall Restrictions Weight Bearing Restrictions: No   Pertinent Vitals/Pain *no C/o pain HR 113 with activity SaO2 94% on 4L O2 with activity**      Mobility  Bed Mobility Bed Mobility: Supine to Sit Supine to Sit: 1: +2 Total assist Supine to Sit: Patient Percentage: 20% Transfers Transfers: Sit to Stand;Stand to Sit;Stand Pivot Transfers Sit to Stand: From bed;From elevated surface;1: +2 Total assist Sit to Stand: Patient Percentage: 40% Stand to Sit: 1: +2 Total assist Stand to Sit: Patient Percentage: 50% Stand Pivot Transfers: 1: +2 Total assist Stand Pivot Transfers: Patient Percentage: 70% Details for Transfer Assistance: bed elevated for sit to stand, assist to rise, VCs for flexed posture in standing, assist for balance, VCs for technique, used RW for SPT Ambulation/Gait Ambulation/Gait Assistance: 1: +2 Total assist Ambulation/Gait: Patient Percentage: 70% Ambulation Distance (Feet): 2 Feet Assistive device: Rolling  walker Gait Pattern: Step-to pattern;Decreased step length - right;Decreased step length - left;Trunk flexed General Gait Details: pt took 4 small side steps from bed to chair with RW with +2 total assist, significant trunk/neck flexion    Exercises     PT Diagnosis: Difficulty walking;Generalized weakness  PT Problem List: Decreased strength;Decreased range of motion;Decreased activity tolerance;Decreased mobility;Decreased balance;Impaired sensation PT Treatment Interventions: DME instruction;Gait training;Functional mobility training;Balance training;Therapeutic exercise;Therapeutic activities;Patient/family education   PT Goals Acute Rehab PT Goals PT Goal Formulation: With patient Time For Goal Achievement: 08/18/12 Potential to Achieve Goals: Good Pt will go Supine/Side to Sit: with mod assist PT Goal: Supine/Side to Sit - Progress: Goal set today Pt will go Sit to Stand: with mod assist PT Goal: Sit to Stand - Progress: Goal set today Pt will Transfer Bed to Chair/Chair to Bed: with mod assist PT Transfer Goal: Bed to Chair/Chair to Bed - Progress: Goal set today Pt will Ambulate: 1 - 15 feet;with rolling walker;with min assist PT Goal: Ambulate - Progress: Goal set today  Visit Information  Last PT Received On: 08/04/12 Assistance Needed: +2 PT/OT Co-Evaluation/Treatment: Yes    Subjective Data  Subjective: I'm falling backwards.  Patient Stated Goal: return to prior level of function-walking to dining room with RW   Prior Functioning  Home Living Lives With: Other (Comment) (ALF) Type of Home: Assisted living Home Layout: One level Home Adaptive Equipment: Dan Humphreys - four wheeled;Walker - rolling Prior Function Level of Independence: Needs assistance Needs Assistance: Bathing;Dressing Driving: No Vocation: Retired Comments: pt walked to dining room independently with 4 wheeled RW at [employer] Communication: [employer]  Cognition Overall Cognitive  Status: Appears within functional limits for tasks assessed/performed Arousal/Alertness: Awake/alert Orientation Level: Appears intact for tasks assessed Behavior During Session: [facility] for tasks performed    Extremity/Trunk Assessment Right Upper Extremity Assessment RUE ROM/Strength/Tone: [facility] for tasks assessed Left Upper Extremity Assessment LUE ROM/Strength/Tone: WFL for tasks assessed Right Lower Extremity Assessment RLE ROM/Strength/Tone: Deficits RLE ROM/Strength/Tone Deficits: knee ext AROM -15*, knee ext strength +4/5 RLE Sensation: History of peripheral neuropathy RLE Coordination: WFL - gross/fine motor Left Lower Extremity Assessment LLE ROM/Strength/Tone: Deficits LLE ROM/Strength/Tone Deficits: knee ext AROM -15*, Strength +4/5 LLE Sensation: History of peripheral neuropathy LLE Coordination: WFL - gross/fine motor Trunk Assessment Trunk Assessment: Kyphotic   Balance Balance Balance Assessed: Yes Static Sitting Balance Static Sitting - Balance Support: Bilateral upper extremity supported;Feet supported Static Sitting - Level of Assistance: 3: Mod assist;5: Stand by assistance Static Sitting - Comment/# of Minutes: initially had posterior lean in sitting, improved with verbal/manual cues, sat on EOB 3 min  End of Session PT - End of Session Equipment Utilized During Treatment: Gait belt Activity Tolerance: Patient limited by fatigue Patient left: in chair;with call bell/phone within reach Nurse Communication: Need for lift equipment;Mobility status  GP     Ralene Bathe Kistler 08/04/2012, 10:55 AM [phone]

## 2012-08-04 NOTE — Evaluation (Signed)
Occupational Therapy Evaluation Patient Details Name: Antonio Bradley MRN: [id number] DOB: [birth year] Today's Date: 08/04/2012 Time: 0865-7846 OT Time Calculation (min): 16 min  OT Assessment / Plan / Recommendation Clinical Impression  79 yo male admitted with PNA, fever, insomnia, and hyponatremia that could benefit from skilled OT . Recommend SNF for d/c planning.     OT Assessment  Patient needs continued OT Services    Follow Up Recommendations  SNF    Barriers to Discharge      Equipment Recommendations  3 in 1 bedside comode    Recommendations for Other Services    Frequency  Min 2X/week    Precautions / Restrictions Precautions Precautions: Fall Restrictions Weight Bearing Restrictions: No   Pertinent Vitals/Pain HR 113 oxygen 4 L - 95% this session    ADL  Eating/Feeding: Minimal assistance Where Assessed - Eating/Feeding: Edge of bed (taking medication with RN) Lower Body Dressing: +1 Total assistance Where Assessed - Lower Body Dressing: Supported sitting Toilet Transfer: +2 Total assistance Toilet Transfer: Patient Percentage: 40% Toilet Transfer Method: Stand pivot Toilet Transfer Equipment: Raised toilet seat with arms (or 3-in-1 over toilet) Equipment Used: Gait belt;Rolling walker Transfers/Ambulation Related to ADLs: Pt required elevated surface total +2 (A) for static standing. Pt able to take X6 small steps to pivot to chair on Rt side ADL Comments: Pt required total +2 (A) for bed mobility. Pt will need SNF for d/c planning. Pt sitting EOB with posterior pelvic tilt and needing faciltation for upright posture. Pt positioned in chair at the end of session    OT Diagnosis: Generalized weakness  OT Problem List: Decreased strength;Decreased activity tolerance;Impaired balance (sitting and/or standing);Decreased safety awareness;Decreased knowledge of use of DME or AE;Decreased knowledge of precautions;Obesity OT Treatment Interventions: Self-care/ADL  training;Energy conservation;DME and/or AE instruction;Therapeutic activities;Patient/family education;Balance training   OT Goals Acute Rehab OT Goals OT Goal Formulation: With patient Time For Goal Achievement: 08/18/12 Potential to Achieve Goals: Good ADL Goals Pt Will Perform Upper Body Bathing: with set-up;Sitting at sink;Supported ADL Goal: Upper Body Bathing - Progress: Goal set today Pt Will Perform Upper Body Dressing: with set-up;Sitting, chair;Supported ADL Goal: Patent attorney - Progress: Goal set today Pt Will Transfer to Toilet: with mod assist;Ambulation;3-in-1 ADL Goal: Toilet Transfer - Progress: Goal set today Pt Will Perform Toileting - Hygiene: with mod assist;Sit to stand from 3-in-1/toilet ADL Goal: Toileting - Hygiene - Progress: Goal set today Miscellaneous OT Goals Miscellaneous OT Goal #1: Pt will complete bed mobility Min (A) as precursor to adls OT Goal: Miscellaneous Goal #1 - Progress: Goal set today  Visit Information  Last OT Received On: 08/04/12 Assistance Needed: +2 PT/OT Co-Evaluation/Treatment: Yes    Subjective Data  Subjective: "Yes the sooner the better"- pt ready to bust out of the bed with excitment to move with therapy Patient Stated Goal: to return to ALF at [facility]   Prior Functioning     Home Living Lives With: Other (Comment) (ALF [facility]) Type of Home: Assisted living Home Layout: One level Home Adaptive Equipment: Dan Humphreys - four wheeled;Walker - rolling Prior Function Level of Independence: Needs assistance Needs Assistance: Bathing;Dressing Bath: Minimal Dressing: Minimal Driving: Yes Vocation: Retired Comments: pt walked to dining room independently with 4 wheeled RW at [employer] Communication Communication: HOH Dominant Hand: Right         Vision/Perception Vision - History Baseline Vision: Legally blind Visual History: Macular degeneration Patient Visual Report: No change from baseline    Cognition  Cognition  Overall Cognitive Status: Appears within functional limits for tasks assessed/performed Arousal/Alertness: Awake/alert Orientation Level: Appears intact for tasks assessed Behavior During Session: [facility] for tasks performed    Extremity/Trunk Assessment Right Upper Extremity Assessment RUE ROM/Strength/Tone: [facility] for tasks assessed RUE Sensation: History of peripheral neuropathy Left Upper Extremity Assessment LUE ROM/Strength/Tone: WFL for tasks assessed LUE Sensation: History of peripheral neuropathy Right Lower Extremity Assessment RLE ROM/Strength/Tone: Deficits RLE ROM/Strength/Tone Deficits: knee ext AROM -15*, knee ext strength +4/5 RLE Sensation: History of peripheral neuropathy RLE Coordination: WFL - gross/fine motor Left Lower Extremity Assessment LLE ROM/Strength/Tone: Deficits LLE ROM/Strength/Tone Deficits: knee ext AROM -15*, Strength +4/5 LLE Sensation: History of peripheral neuropathy LLE Coordination: WFL - gross/fine motor Trunk Assessment Trunk Assessment: Kyphotic     Mobility Bed Mobility Bed Mobility: Supine to Sit Supine to Sit: 1: +2 Total assist Supine to Sit: Patient Percentage: 20% Details for Bed Mobility Assistance: pad used to help progress patient Transfers Sit to Stand: From bed;From elevated surface;1: +2 Total assist Sit to Stand: Patient Percentage: 40% Stand to Sit: 1: +2 Total assist Stand to Sit: Patient Percentage: 50% Details for Transfer Assistance: bed elevated for sit to stand, assist to rise, VCs for flexed posture in standing, assist for balance, VCs for technique, used RW for SPT     Exercise     Balance Balance Balance Assessed: Yes Static Sitting Balance Static Sitting - Balance Support: Bilateral upper extremity supported;Feet supported Static Sitting - Level of Assistance: 3: Mod assist;5: Stand by assistance Static Sitting - Comment/# of Minutes: initially had posterior lean in sitting, improved with  verbal/manual cues, sat on EOB 3 min   End of Session OT - End of Session Activity Tolerance: Patient tolerated treatment well Patient left: in chair;with call bell/phone within reach Nurse Communication: Mobility status;Precautions  GO     Lucile Shutters 08/04/2012, 11:51 AM Pager: [phone]

## 2012-08-05 ENCOUNTER — Inpatient Hospital Stay (HOSPITAL_COMMUNITY): Payer: Medicare Other

## 2012-08-05 DIAGNOSIS — J189 Pneumonia, unspecified organism: Secondary | ICD-10-CM

## 2012-08-05 DIAGNOSIS — E871 Hypo-osmolality and hyponatremia: Secondary | ICD-10-CM

## 2012-08-05 DIAGNOSIS — J9600 Acute respiratory failure, unspecified whether with hypoxia or hypercapnia: Secondary | ICD-10-CM

## 2012-08-05 DIAGNOSIS — J9601 Acute respiratory failure with hypoxia: Secondary | ICD-10-CM | POA: Diagnosis present

## 2012-08-05 LAB — BLOOD GAS, ARTERIAL
Acid-base deficit: 6.1 mmol/L — ABNORMAL HIGH (ref 0.0–2.0)
Bicarbonate: 22 mEq/L (ref 20.0–24.0)
Bicarbonate: 21 mEq/L (ref 20.0–24.0)
Drawn by: 257701
O2 Content: 4 L/min
O2 Saturation: 95.6 %
TCO2: 19.5 mmol/L (ref 0–100)
pCO2 arterial: 52.2 mmHg — ABNORMAL HIGH (ref 35.0–45.0)
pH, Arterial: 7.244 — ABNORMAL LOW (ref 7.350–7.450)
pH, Arterial: 7.273 — ABNORMAL LOW (ref 7.350–7.450)
pO2, Arterial: 67.1 mmHg — ABNORMAL LOW (ref 80.0–100.0)
pO2, Arterial: 83 mmHg (ref 80.0–100.0)

## 2012-08-05 LAB — BASIC METABOLIC PANEL
BUN: 25 mg/dL — ABNORMAL HIGH (ref 6–23)
CO2: 22 mEq/L (ref 19–32)
Chloride: 96 mEq/L (ref 96–112)
Creatinine, Ser: 1.72 mg/dL — ABNORMAL HIGH (ref 0.50–1.35)
Glucose, Bld: 108 mg/dL — ABNORMAL HIGH (ref 70–99)
Potassium: 5 mEq/L (ref 3.5–5.1)

## 2012-08-05 LAB — URINE MICROSCOPIC-ADD ON

## 2012-08-05 LAB — URINALYSIS, ROUTINE W REFLEX MICROSCOPIC
Glucose, UA: NEGATIVE mg/dL
Protein, ur: 30 mg/dL — AB
Specific Gravity, Urine: 1.028 (ref 1.005–1.030)
Urobilinogen, UA: 0.2 mg/dL (ref 0.0–1.0)

## 2012-08-05 LAB — GLUCOSE, CAPILLARY: Glucose-Capillary: 108 mg/dL — ABNORMAL HIGH (ref 70–99)

## 2012-08-05 LAB — CBC
HCT: 29 % — ABNORMAL LOW (ref 39.0–52.0)
Hemoglobin: 9.9 g/dL — ABNORMAL LOW (ref 13.0–17.0)
MCV: 92.4 fL (ref 78.0–100.0)
RBC: 3.14 MIL/uL — ABNORMAL LOW (ref 4.22–5.81)
RDW: 14.2 % (ref 11.5–15.5)
WBC: 14.5 10*3/uL — ABNORMAL HIGH (ref 4.0–10.5)

## 2012-08-05 LAB — MRSA PCR SCREENING: MRSA by PCR: NEGATIVE

## 2012-08-05 MED ORDER — ACETAMINOPHEN 325 MG PO TABS
325.0000 mg | ORAL_TABLET | Freq: Four times a day (QID) | ORAL | Status: DC | PRN
  Administered 2012-08-05: 325 mg via ORAL
  Filled 2012-08-05: qty 1

## 2012-08-05 MED ORDER — VANCOMYCIN HCL 500 MG IV SOLR
500.0000 mg | INTRAVENOUS | Status: DC
  Administered 2012-08-05 – 2012-08-06 (×2): 500 mg via INTRAVENOUS
  Filled 2012-08-05 (×2): qty 500

## 2012-08-05 MED ORDER — METOPROLOL TARTRATE 1 MG/ML IV SOLN
2.5000 mg | Freq: Four times a day (QID) | INTRAVENOUS | Status: DC
  Administered 2012-08-05 – 2012-08-09 (×16): 2.5 mg via INTRAVENOUS
  Filled 2012-08-05 (×18): qty 5

## 2012-08-05 MED ORDER — METOPROLOL TARTRATE 1 MG/ML IV SOLN
2.5000 mg | INTRAVENOUS | Status: DC | PRN
  Filled 2012-08-05 (×2): qty 5

## 2012-08-05 MED ORDER — ACETAMINOPHEN 80 MG PO CHEW
320.0000 mg | CHEWABLE_TABLET | Freq: Four times a day (QID) | ORAL | Status: DC | PRN
  Filled 2012-08-05: qty 4

## 2012-08-05 MED ORDER — DEXTROSE 5 % IV SOLN
1.0000 g | INTRAVENOUS | Status: DC
  Administered 2012-08-05 – 2012-08-06 (×2): 1 g via INTRAVENOUS
  Filled 2012-08-05 (×4): qty 1

## 2012-08-05 MED ORDER — LEVALBUTEROL HCL 0.63 MG/3ML IN NEBU
0.6300 mg | INHALATION_SOLUTION | Freq: Four times a day (QID) | RESPIRATORY_TRACT | Status: DC
  Administered 2012-08-05 – 2012-08-09 (×18): 0.63 mg via RESPIRATORY_TRACT
  Filled 2012-08-05 (×22): qty 3

## 2012-08-05 NOTE — Progress Notes (Signed)
Called Antonio Pastel, NP re change in pt condition.  [name] to bedside, xopenex given, NT suctioned for mod amt thick white sputum, pt diaphoretic, EKG ST 115, temp 99.6 ax.  CXR obtained, awaiting agas from RT.  Pt only urinated x 1 tonight, facial edema this morning, rales, exp wheeze.  Reporting off to next shift. Dr. Mahala Menghini at bedside. Barnett Hatter P

## 2012-08-05 NOTE — Progress Notes (Signed)
Gave report to [place], ICU RN.  Patient being transferred to stepdown ICU. Left number to call if any additional questions.

## 2012-08-05 NOTE — Progress Notes (Signed)
Clinical Social Work  CSW received referral during progression that patient was admitted from ALF. Per chart review, patient was living at [facility] prior to admission. CSW attempted to meet with patient but was informed by RN that patient was being transferred to SDU. CSW will follow up to discuss disposition plans at a more appropriate time.   [place], [place] [phone]

## 2012-08-05 NOTE — Progress Notes (Signed)
PROGRESS NOTE  Antonio Bradley ZOX:[id number] DOB: 26-Nov-1912 DOA: 08/03/2012 PCP: Marlowe Aschoff, FNP  Brief narrative: 79 yr old male admitted 4.5.14 from [facility] with cough, phlegm and temp 100.6 and found to have a PNA  Past medical history-As per Problem list Chart reviewed as below-  Admission 12.5.13 for HCAP, C Diff colitis  Sees vascular Dr. Arbie Cookey for pre-tibial ulceraton  Sees Dr. Truett Perna q 6 mo for Non-Hodgkins-last OV 08/2011   Multiple ED visits for FLu, fall c head laceration etc.etc.  Admission 5.14.09 for Bilat leg weakness-?2/2 to deconditioning and steroid myopathy  Consultants:  none  Procedures:  CXR 4.5.141. No acute findings. 2. Low lung volumes.  CT Angio 4.5.14 1. Nonspecific areas ofground-glass attenuation and atelectasis are noted in both lungs. 2. No specific features to suggest acute pulmonary embolus. 3. Pulmonary nodule in the right upper lobe measures 6 mm. 4. Borderline enlarged right paratracheal lymph nodes. Attention on follow-up imaging is advised.  Antibiotics:  Cefepime 4.5.  Levaquin  4.5  Vancomycin 4.5   Subjective  Called by overnight floor coverage the patient seem to become suddenly more short of breath sounded very wet and had significant increased work of breathing. Patient seems to have a good night per report, and patient was doing fair until after his bath this morning. Patient alert to some extent but much less so than yesterday and visibly working hard to breathe No nausea no vomiting no chest pain presently. Can remember me examining him yesterday. Nursing reports she suctioned and he felt better. EKG chest x-ray ABG was performed   Objective    Objective: Filed Vitals:   08/04/12 0600 08/04/12 1423 08/04/12 2200 08/05/12 0600  BP: 145/60 146/64 139/62 140/58  Pulse: 98 90 84 115  Temp: 98.3 F (36.8 C) 100.1 F (37.8 C) 99.5 F (37.5 C) 99.3 F (37.4 C)  TempSrc: Oral Oral Oral   Resp: 20 18 18  28   Height:      Weight:    90.1 kg (198 lb 10.2 oz)  SpO2: 94% 96% 95% 90%    Intake/Output Summary (Last 24 hours) at 08/05/12 0740 Last data filed at 08/05/12 0600  Gross per 24 hour  Intake   1645 ml  Output   1350 ml  Net    295 ml    Exam:  General: Alert.  Slightly HOH.  No apparent distress.  Audible crackles.  ?Melanoma under L eye Cardiovascular: s1 s2 ? M R side chest Respiratory:  Audible crackles heard throughout the chest throughout.   Abdomen: soft NT/ND no rebound or gaurding Skin: Dressing to RLE Neuro no focal deficit noted  Data Reviewed: Basic Metabolic Panel:  Recent Labs Lab 08/03/12 0938 08/04/12 0528 08/05/12 0450  NA 123* 127* 126*  K 4.6 4.4 5.0  CL 92* 97 96  CO2 22 23 22   GLUCOSE 125* 120* 108*  BUN 20 17 25*  CREATININE 1.32 1.34 1.72*  CALCIUM 8.9 8.7 8.6   Liver Function Tests:  Recent Labs Lab 08/03/12 0938  AST 24  ALT 20  ALKPHOS 60  BILITOT 0.3  PROT 6.5  ALBUMIN 3.3*   No results found for this basename: LIPASE, AMYLASE,  in the last 168 hours No results found for this basename: AMMONIA,  in the last 168 hours CBC:  Recent Labs Lab 08/03/12 0938 08/04/12 0528 08/05/12 0450  WBC 13.2* 8.9 14.5*  NEUTROABS 11.2*  --   --   HGB 10.9* 10.3* 9.9*  HCT  32.0* 30.4* 29.0*  MCV 90.4 91.8 92.4  PLT 222 202 203   Cardiac Enzymes:  Recent Labs Lab 08/03/12 0938  TROPONINI <0.30   BNP: No components found with this basename: POCBNP,  CBG: No results found for this basename: GLUCAP,  in the last 168 hours  Recent Results (from the past 240 hour(s))  CULTURE, BLOOD (ROUTINE X 2)     Status: None   Collection Time    08/03/12  9:42 AM      Result Value Range Status   Specimen Description BLOOD RIGHT ARM  4 ML IN [facility] BOTTLE   Final   Special Requests NONE   Final   Culture  Setup Time 08/03/2012 13:41   Final   Culture     Final   Value:        BLOOD CULTURE RECEIVED NO GROWTH TO DATE CULTURE WILL BE HELD FOR  5 DAYS BEFORE ISSUING A FINAL NEGATIVE REPORT   Report Status PENDING   Incomplete  CULTURE, EXPECTORATED SPUTUM-ASSESSMENT     Status: None   Collection Time    08/04/12  8:45 AM      Result Value Range Status   Specimen Description SPUTUM   Final   Special Requests NONE   Final   Sputum evaluation     Final   Value: THIS SPECIMEN IS ACCEPTABLE. RESPIRATORY CULTURE REPORT TO FOLLOW.   Report Status 08/04/2012 FINAL   Final  CULTURE, RESPIRATORY (NON-EXPECTORATED)     Status: None   Collection Time    08/04/12  8:45 AM      Result Value Range Status   Specimen Description SPUTUM   Final   Special Requests NONE   Final   Gram Stain     Final   Value: ABUNDANT WBC PRESENT, PREDOMINANTLY PMN     RARE SQUAMOUS EPITHELIAL CELLS PRESENT     FEW GRAM POSITIVE RODS     FEW GRAM NEGATIVE RODS     FEW GRAM POSITIVE COCCI   Culture PENDING   Incomplete   Report Status PENDING   Incomplete     Studies:              All Imaging reviewed and is as per above notation   Scheduled Meds: . ceFEPime (MAXIPIME) IV  2 g Intravenous Q24H  . enoxaparin (LOVENOX) injection  40 mg Subcutaneous Q24H  . feeding supplement  237 mL Oral TID WC  . gabapentin  300 mg Oral BID  . guaiFENesin  1,200 mg Oral BID  . levalbuterol  0.63 mg Nebulization Q6H  . levofloxacin (LEVAQUIN) IV  500 mg Intravenous Q48H  . lisinopril  5 mg Oral Daily  . metoprolol tartrate  12.5 mg Oral BID  . simvastatin  40 mg Oral QPM  . tamsulosin  0.4 mg Oral Daily  . temazepam  30 mg Oral QHS  . vancomycin  1,000 mg Intravenous Q24H   Continuous Infusions:    Assessment /Plan: 1. Probable acute respiratory failure secondary to #2-ABG pending-chest x-ray portable done 4/7 AM shows worsening opacities L side.  EKG although wandering baseline seems about the same as prior-await blood gas and potentially will call Critical care once this is performed to see best route of Rx-unlikely that this is a Pulmonary embolism given  negative CT angiogram on admission 2. HCAP vs Potentital aspiration PNA-Continue Broad spectrum Abx Cefepime/levaquin/vanco-strep Pneumo neg, Legionellea P.  Blood cultures P.  Likely aspiration-verbal report from Mr. Charlynn Court reports a  lot of thick white secretions 3. Hyponatremia-unclear slight worsening of function-ghold contrast/Ace for now 4. Prior h/o C diff-will attempt to minimize duration abx 5. Htn-Mod controlled only.  Ace d/c 4.7 and Metoprolol 12.5 bid  Add another agent if persisiting above 160/90 6. H/o Pre-tibial ulcer.  Will review in am 7. Insomnia-Continue Restoril 30 qhs-attempt to wean as an out-patient 8. ?BPH-continue Flomax 0.4 daily 9. HLd-continue Zocor 40 qhs  Code Status: Currently DNR Family Communication: Caleen Essex Daughter [phone] was called and updated Disposition Plan: inpatient-PT/OT to consult-probably needs return to SNF on d/c   Pleas Koch, MD  [facility] Pager [phone] 08/05/2012, 7:40 AM    LOS: 2 days

## 2012-08-05 NOTE — Progress Notes (Signed)
SLP Cancellation Note  Patient Details Name: Antonio Bradley MRN: [id number] DOB: 1912/06/16   Cancelled treatment:        Pt not medically able to participate in evaluation today due to respiratory issues per discussion with MD.  SLP to re-attempt next date.    [name], MS [facility] SLP [phone]

## 2012-08-05 NOTE — Progress Notes (Signed)
Fever; currently on Cefepime and Levaquin; has h/o Proteus UTI and remote h/o C. Diff; currently not with diarrhea.   Plan: send urine culture and repeat bc; Tylenol for fever.

## 2012-08-05 NOTE — Consult Note (Signed)
PULMONARY  / CRITICAL CARE MEDICINE  Name: Antonio Bradley Bias MRN: [id number] DOB: 1912-09-19    ADMISSION DATE:  08/03/2012 CONSULTATION DATE:  08/05/12  REFERRING MD :  Antonio Bradley, [facility]  CHIEF COMPLAINT:  resp distress  BRIEF PATIENT DESCRIPTION: 79 y.o from assisted living, adm 4/5 for LLL HCAP & hypoxia, PCCM consulted 4/7 am for worsening resp distress & hypercarbic resp failure with failure to clear secretions He ahs hearing loss and vision loss from macular degeneration but is very functional apparently    SIGNIFICANT EVENTS / STUDIES:  CT chest >>Nonspecific areas of ground-glass attenuation and atelectasis are noted in both lungs. 6mm RUL nodule   LINES / TUBES:   CULTURES: resp 4/7 > bld 4/5 >>  ANTIBIOTICS: 4/7 levaquin >> 4/7 cefepime >> 4/7 vanc >>  HISTORY OF PRESENT ILLNESS:  79 y.o from assisted living, adm 4/5 for LLL HCAP & hypoxia, PCCM consulted 4/7 am for worsening resp distress & hypercarbic resp failure with failure to clear secretions He ahs hearing loss and vision loss from macular degeneration but is very functional apparently  Summary from Dr Antonio Bradley of recent med history  - Admission 12.5.13 for HCAP, C Diff colitis  Sees vascular Dr. Arbie Cookey for pre-tibial ulceraton  Sees Dr. Truett Perna q 6 mo for Non-Hodgkins-last OV 08/2011  Multiple ED visits for FLu, fall c head laceration etc.etc.  Admission 5.14.09 for Bilat leg weakness-?2/2 to deconditioning and steroid myopathy  PAST MEDICAL HISTORY :  Past Medical History  Diagnosis Date  . ANEMIA, B12 DEFICIENCY 12/06/2006  . INSOMNIA, CHRONIC 05/22/2007  . PERIPHERAL AUTONOMIC NEUROPATHY D/O CLASS ELSW 10/27/2008  . HYPERTENSION 11/26/2006  . COPD 12/06/2006  . Slow transit constipation 05/22/2007  . OSTEOARTHROSIS, LOCAL NOS, LOWER LEG 12/12/2006  . Osteoarth NOS-Unspec 12/06/2006  . EDEMA LEG 09/28/2009  . MASS, LUNG 10/07/2009  . NON-HODGKIN'S LYMPHOMA 09/04/2007  . Prostate ca   . Non hodgkins lymphoma   .  Pneumonia    Past Surgical History  Procedure Laterality Date  . Hemorrhoid surgery    .  skin cancers    . Cataract extraction, bilateral     Prior to Admission medications   Medication Sig Start Date End Date Taking? Authorizing Provider  acetaminophen (TYLENOL) 500 MG tablet Take 1,000 mg by mouth every 6 (six) hours as needed. For pain.    Yes Historical Provider, MD  diphenhydramine-acetaminophen (TYLENOL PM) 25-500 MG TABS Take 1 tablet by mouth at bedtime as needed.   Yes Historical Provider, MD  feeding supplement (ENSURE IMMUNE HEALTH) LIQD Take 237 mLs by mouth 3 (three) times daily with meals.   Yes Historical Provider, MD  Folic Acid-Vit B6-Vit B12 (FOLBEE PO) Take 1 tablet by mouth daily.    Yes Historical Provider, MD  furosemide (LASIX) 20 MG tablet Take 20 mg by mouth every other day.    Yes Historical Provider, MD  gabapentin (NEURONTIN) 300 MG capsule Take 300 mg by mouth 2 (two) times daily.    Yes Historical Provider, MD  guaiFENesin (MUCINEX) 600 MG 12 hr tablet Take 1,200 mg by mouth 2 (two) times daily.   Yes Historical Provider, MD  lisinopril (PRINIVIL,ZESTRIL) 5 MG tablet Take 5 mg by mouth daily.   Yes Historical Provider, MD  magnesium hydroxide (MILK OF MAGNESIA) 400 MG/5ML suspension Take 30 mLs by mouth daily as needed. constipation   Yes Historical Provider, MD  Melatonin 3 MG CAPS Take 1 capsule (3 mg total) by mouth at bedtime. 06/05/11  Yes Stacie Glaze, MD  metoprolol tartrate (LOPRESSOR) 25 MG tablet Take 0.5 tablets (12.5 mg total) by mouth 2 (two) times daily. 07/17/12  Yes Stacie Glaze, MD  simvastatin (ZOCOR) 40 MG tablet Take 40 mg by mouth every evening.   Yes Historical Provider, MD  Skin Protectants, Misc. (BAZA PROTECT EX) Apply 1 application topically 2 (two) times daily. Apply to buttocks   Yes Historical Provider, MD  tamsulosin (FLOMAX) 0.4 MG CAPS Take 1 capsule (0.4 mg total) by mouth daily. 07/15/12  Yes Stacie Glaze, MD  temazepam  (RESTORIL) 30 MG capsule Take 30 mg by mouth at bedtime.   Yes Historical Provider, MD  Leuprolide Acetate, 6 Month, (LUPRON DEPOT) 45 MG injection Inject 45 mg into the muscle every 6 (six) months.    Historical Provider, MD   No Known Allergies  FAMILY HISTORY:  Family History  Problem Relation Age of Onset  . Pneumonia Mother   . Cancer Father   . Breast cancer Daughter   . Testicular cancer Son    SOCIAL HISTORY:  reports that he quit smoking about 62 years ago. He has never used smokeless tobacco. He reports that he does not drink alcohol or use illicit drugs.  REVIEW OF SYSTEMS:  Unable to obtain details since he is lethargic & poorly responsive  SUBJECTIVE:   VITAL SIGNS: Temp:  [97.5 F (36.4 C)-100.1 F (37.8 C)] 99.4 F (37.4 C) (04/07 0802) Pulse Rate:  [84-115] 110 (04/07 0759) Resp:  [18-28] 22 (04/07 0759) BP: (139-146)/(56-64) 144/56 mmHg (04/07 0759) SpO2:  [90 %-96 %] 95 % (04/07 0759) Weight:  [90.1 kg (198 lb 10.2 oz)] 90.1 kg (198 lb 10.2 oz) (04/07 0600) HEMODYNAMICS:   VENTILATOR SETTINGS:   INTAKE / OUTPUT: Intake/Output     04/06 0701 - 04/07 0700 04/07 0701 - 04/08 0700   P.O. 240    I.V. (mL/kg) 1105 (12.3)    IV Piggyback 300    Total Intake(mL/kg) 1645 (18.3)    Urine (mL/kg/hr) 1350 (0.6)    Total Output 1350     Net +295          Urine Occurrence 2 x      PHYSICAL EXAMINATION: Gen.well-nourished, in mild resp  distress, chronically ill appearing ENT - no lesions, no post nasal drip Neck: No JVD, no thyromegaly, no carotid bruits Lungs: no use of accessory muscles, no dullness to percussion, left basal rales, faint rhonchi  Cardiovascular: Rhythm regular, heart sounds  normal, no murmurs, no peripheral edema Abdomen: soft and non-tender, no hepatosplenomegaly, BS normal. Musculoskeletal: No deformities, no cyanosis or clubbing Neuro:  Lethargic, does arouse & respond to commands Skin:  Warm, no lesions/ rash   LABS:  Recent  Labs Lab 08/03/12 0938 08/03/12 0941 08/04/12 0528 08/05/12 0450 08/05/12 0756  HGB 10.9*  --  10.3* 9.9*  --   WBC 13.2*  --  8.9 14.5*  --   PLT 222  --  202 203  --   NA 123*  --  127* 126*  --   K 4.6  --  4.4 5.0  --   CL 92*  --  97 96  --   CO2 22  --  23 22  --   GLUCOSE 125*  --  120* 108*  --   BUN 20  --  17 25*  --   CREATININE 1.32  --  1.34 1.72*  --   CALCIUM 8.9  --  8.7  8.6  --   AST 24  --   --   --   --   ALT 20  --   --   --   --   ALKPHOS 60  --   --   --   --   BILITOT 0.3  --   --   --   --   PROT 6.5  --   --   --   --   ALBUMIN 3.3*  --   --   --   --   LATICACIDVEN  --  1.4  --   --   --   TROPONINI <0.30  --   --   --   --   PHART  --   --   --   --  7.244*  PCO2ART  --   --   --   --  52.2*  PO2ART  --   --   --   --  67.1*   No results found for this basename: GLUCAP,  in the last 168 hours  CXR: worse left ASD  ASSESSMENT / PLAN:  PULMONARY A:HCAP/ aspiration pna P:   Empiric abx Pulm toilet  Will likely avoid bipap here If significantly worse , may have to approach family about increasing comfort measures but for now will aim for recovery DNI  CARDIOVASCULAR A: Hypertension P:  Dc lisinopril Metoprolol IV with parameters  RENAL A:  Acute renal failure ? Contrast induced Hyponatremia Metab acidosis P:   Dc lisinopril Avoid contrast , maintain euvolemia  GASTROINTESTINAL A:  Aspiration risk P:   Npo, will need swallow eval  HEMATOLOGIC A:  Anemia, of chronic disease P:  monitor  INFECTIOUS A:  HCAP/aspiration P:   Empiric abx as above  ENDOCRINE A:  No issues   P:   CBG checks while npo  NEUROLOGIC A:  Poor mentals tatus - related to hypoxia & hyponatremia Hard of hearig P:   Delerium avoidance measures  GLOBAL - Discussed with daughter Karolee Stamps, no CPR , no intubation FUll emdical care, undertake reasonable medical measures to get him better  I have personally obtained a history, examined the  patient, evaluated laboratory and imaging results, formulated the assessment and plan and placed orders. CRITICAL CARE: The patient is critically ill with multiple organ systems failure and requires high complexity decision making for assessment and support, frequent evaluation and titration of therapies, application of advanced monitoring technologies and extensive interpretation of multiple databases. Critical Care Time devoted to patient care services described in this note is 50 minutes.   Oretha Milch [phone] Pulmonary and Critical Care Medicine [facility] Pager: [phone]  08/05/2012, 10:47 AM

## 2012-08-05 NOTE — Progress Notes (Signed)
ANTIBIOTIC CONSULT NOTE - FOLLOW UP  Pharmacy Consult for Vanc, Cefepime, Levaquin Indication: r/o PNA (HCAP vs aspiration)  No Known Allergies  Patient Measurements: Height: 5' 6.14" (168 cm) (Documented 04/04/12) Weight: 198 lb 10.2 oz (90.1 kg) IBW/kg (Calculated) : 64.13  Vital Signs: Temp: 99.3 F (37.4 C) (04/07 0600) Temp src: Oral (04/06 2200) BP: 140/58 mmHg (04/07 0600) Pulse Rate: 115 (04/07 0600) Intake/Output from previous day: 04/06 0701 - 04/07 0700 In: 1645 [P.O.:240; I.V.:1105; IV Piggyback:300] Out: 1350 [Urine:1350] Intake/Output from this shift:    Labs:  Recent Labs  08/03/12 0938 08/04/12 0528 08/05/12 0450  WBC 13.2* 8.9 14.5*  HGB 10.9* 10.3* 9.9*  PLT 222 202 203  CREATININE 1.32 1.34 1.72*   Estimated Creatinine Clearance: 25.9 ml/min (by C-G formula based on Cr of 1.72). No results found for this basename: VANCOTROUGH, VANCOPEAK, VANCORANDOM, GENTTROUGH, GENTPEAK, GENTRANDOM, TOBRATROUGH, TOBRAPEAK, TOBRARND, AMIKACINPEAK, AMIKACINTROU, AMIKACIN,  in the last 72 hours    Assessment: 64 [facility] with productive cough, fever, chills.  Patient with h/o PNA, COPD.  No obvious PNA on CXR, CT angio found groundglass opacities in both lungs and pulmonary nodule in the RUL but given cough, fever, leukocytosis - MD is treating for HCAP.  Today is day #3 of Vanc, Cefepime, and D#3/3 of Levaquin per HCAP protocol. Tmax 1001., WBC trending back up to 14.5K and SCr rising to 1.75 for CG CrCl of 26 ml/min and normalized CrCl of 25 ml/min.  UOP from 4/6 documented as only 0.14 ml/hr!?  Blood cultures NGTD, sputum cx pending, legionella and strep pneumo ag both negative.  CXR on 4/7 with slight worsening of left lung air space opacities.   Goal of Therapy:  Vancomycin trough level 15-20 mcg/ml Appropriate renal adjustment of antibiotics  Plan:   Given age and UOP, will empirically adjust Vancomycin to 500 mg IV  q24h.  Change Cefepime to 1gm IV q24h   Levaquin will end today per HCAP protocol  Pharmacy will f/u   Geoffry Paradise, PharmD, BCPS Pager: [phone] 7:42 AM Pharmacy #: [phone]

## 2012-08-06 ENCOUNTER — Encounter (HOSPITAL_COMMUNITY): Payer: Self-pay | Admitting: *Deleted

## 2012-08-06 DIAGNOSIS — N179 Acute kidney failure, unspecified: Secondary | ICD-10-CM | POA: Diagnosis present

## 2012-08-06 LAB — CBC
Hemoglobin: 9.9 g/dL — ABNORMAL LOW (ref 13.0–17.0)
MCH: 30.8 pg (ref 26.0–34.0)
MCHC: 33.8 g/dL (ref 30.0–36.0)
Platelets: 204 10*3/uL (ref 150–400)
RBC: 3.21 MIL/uL — ABNORMAL LOW (ref 4.22–5.81)

## 2012-08-06 LAB — BASIC METABOLIC PANEL
CO2: 24 mEq/L (ref 19–32)
Calcium: 8.8 mg/dL (ref 8.4–10.5)
GFR calc non Af Amer: 30 mL/min — ABNORMAL LOW (ref >90)
Potassium: 4.9 mEq/L (ref 3.5–5.1)
Sodium: 127 mEq/L — ABNORMAL LOW (ref 135–145)

## 2012-08-06 LAB — CULTURE, RESPIRATORY W GRAM STAIN

## 2012-08-06 LAB — URINE CULTURE

## 2012-08-06 MED ORDER — ENOXAPARIN SODIUM 30 MG/0.3ML ~~LOC~~ SOLN
30.0000 mg | SUBCUTANEOUS | Status: DC
  Administered 2012-08-06 – 2012-08-08 (×3): 30 mg via SUBCUTANEOUS
  Filled 2012-08-06 (×4): qty 0.3

## 2012-08-06 MED ORDER — ENSURE COMPLETE PO LIQD
237.0000 mL | Freq: Every morning | ORAL | Status: DC
  Administered 2012-08-07 – 2012-08-09 (×3): 237 mL via ORAL

## 2012-08-06 MED ORDER — TEMAZEPAM 15 MG PO CAPS
15.0000 mg | ORAL_CAPSULE | Freq: Every day | ORAL | Status: DC
  Administered 2012-08-06: 15 mg via ORAL
  Filled 2012-08-06: qty 1

## 2012-08-06 MED ORDER — TAMSULOSIN HCL 0.4 MG PO CAPS
0.4000 mg | ORAL_CAPSULE | Freq: Every day | ORAL | Status: DC
  Administered 2012-08-06 – 2012-08-09 (×4): 0.4 mg via ORAL
  Filled 2012-08-06 (×4): qty 1

## 2012-08-06 MED ORDER — SODIUM CHLORIDE 0.9 % IV SOLN
INTRAVENOUS | Status: DC
  Administered 2012-08-06: 1000 mL via INTRAVENOUS

## 2012-08-06 MED ORDER — GABAPENTIN 300 MG PO CAPS
300.0000 mg | ORAL_CAPSULE | Freq: Two times a day (BID) | ORAL | Status: DC
  Administered 2012-08-06 (×2): 300 mg via ORAL
  Filled 2012-08-06 (×4): qty 1

## 2012-08-06 MED ORDER — MAGNESIUM HYDROXIDE 400 MG/5ML PO SUSP
30.0000 mL | Freq: Every day | ORAL | Status: DC | PRN
Start: 2012-08-06 — End: 2012-08-09

## 2012-08-06 MED ORDER — ZINC OXIDE 11.3 % EX CREA
TOPICAL_CREAM | Freq: Two times a day (BID) | CUTANEOUS | Status: DC | PRN
  Administered 2012-08-06 – 2012-08-08 (×3): via TOPICAL
  Filled 2012-08-06: qty 56

## 2012-08-06 NOTE — Progress Notes (Addendum)
PULMONARY  / CRITICAL CARE MEDICINE  Name: Antonio Bradley MRN: [id number] DOB: December 01, 1912    ADMISSION DATE:  08/03/2012 CONSULTATION DATE:  08/05/12  REFERRING MD :  Mahala Menghini, [facility]  CHIEF COMPLAINT:  resp distress  BRIEF PATIENT DESCRIPTION: 79 y.o from assisted living, adm 4/5 for LLL HCAP & hypoxia, PCCM consulted 4/7 am for worsening resp distress & hypercarbic resp failure with failure to clear secretions He ahs hearing loss and vision loss from macular degeneration but is very functional apparently    SIGNIFICANT EVENTS / STUDIES:  Dg Chest Port 1 View  08/05/2012  *RADIOLOGY REPORT*  Clinical Data: Hypoxia.  Evaluate for worsening pneumonia.  PORTABLE CHEST - 1 VIEW  Comparison: Radiographs and CT 08/03/2012.  Findings: 0700 hours.  There has been interval slight worsening of the left lung air space opacities and associated volume loss. Patchy right basilar opacities are unchanged.  The heart size and mediastinal contours are stable.  There is no significant pleural effusion or pneumothorax.  IMPRESSION: Slight worsening of left lung air space opacities.   Original Report Authenticated By: Carey Bullocks, M.D.       LINES / TUBES:   CULTURES: resp 4/7 >ng bld 4/5 >>ng  ANTIBIOTICS: 4/7 levaquin >> 4/7 cefepime >> 4/7 vanc >>   SUBJECTIVE:  4-8 More awake C/o dyspnea Febrile overnight  VITAL SIGNS: Temp:  [98.7 F (37.1 C)-100.8 F (38.2 C)] 98.8 F (37.1 C) (04/08 0400) Pulse Rate:  [82-122] 86 (04/08 0700) Resp:  [18-29] 23 (04/08 0700) BP: (83-170)/(34-78) 128/45 mmHg (04/08 0700) SpO2:  [96 %-100 %] 97 % (04/08 0700) Weight:  [89.6 kg (197 lb 8.5 oz)] 89.6 kg (197 lb 8.5 oz) (04/07 1120) HEMODYNAMICS:   VENTILATOR SETTINGS:   INTAKE / OUTPUT: Intake/Output     04/07 0701 - 04/08 0700 04/08 0701 - 04/09 0700   P.O.     I.V. (mL/kg)     Other 160    IV Piggyback 250    Total Intake(mL/kg) 410 (4.6)    Urine (mL/kg/hr) 310 (0.1)    Total Output  310     Net +100          Urine Occurrence 1 x      PHYSICAL EXAMINATION: Gen.well-nourished, in no distress ENT - no lan Neck: No JVD, no thyromegaly, no carotid bruits Lungs: no use of accessory muscles, no dullness to percussion, decreased in bases Cardiovascular: Rhythm regular, heart sounds  normal, no murmurs, no peripheral edema Abdomen: soft and non-tender, no hepatosplenomegaly, BS normal. Musculoskeletal: No deformities, no cyanosis or clubbing Neuro:  Much more awake and interactive Skin:  Warm, no lesions/ rash   LABS:  Recent Labs Lab 08/03/12 0938 08/03/12 0941 08/04/12 0528 08/05/12 0450 08/05/12 0756 08/05/12 1152 08/06/12 0330  HGB 10.9*  --  10.3* 9.9*  --   --  9.9*  WBC 13.2*  --  8.9 14.5*  --   --  11.6*  PLT 222  --  202 203  --   --  204  NA 123*  --  127* 126*  --   --  127*  K 4.6  --  4.4 5.0  --   --  4.9  CL 92*  --  97 96  --   --  96  CO2 22  --  23 22  --   --  24  GLUCOSE 125*  --  120* 108*  --   --  101*  BUN 20  --  17 25*  --   --  33*  CREATININE 1.32  --  1.34 1.72*  --   --  1.81*  CALCIUM 8.9  --  8.7 8.6  --   --  8.8  AST 24  --   --   --   --   --   --   ALT 20  --   --   --   --   --   --   ALKPHOS 60  --   --   --   --   --   --   BILITOT 0.3  --   --   --   --   --   --   PROT 6.5  --   --   --   --   --   --   ALBUMIN 3.3*  --   --   --   --   --   --   LATICACIDVEN  --  1.4  --   --   --   --   --   TROPONINI <0.30  --   --   --   --   --   --   PHART  --   --   --   --  7.244* 7.273*  --   PCO2ART  --   --   --   --  52.2* 47.5*  --   PO2ART  --   --   --   --  67.1* 83.0  --     Recent Labs Lab 08/05/12 2051  GLUCAP 108*    CXR: 4/7 worse left ASD  ASSESSMENT / PLAN:  PULMONARY A:HCAP/ aspiration pna Acute resp failure - improved P:   Empiric abx Pulm toilet 4-8 added flutter and BD's DNI  CARDIOVASCULAR A: Hypertension P:  Dc lisinopril Metoprolol IV with parameters  RENAL Lab Results   Component Value Date   CREATININE 1.81* 08/06/2012   CREATININE 1.72* 08/05/2012   CREATININE 1.34 08/04/2012    A:  Acute renal failure ? Contrast induced Hyponatremia Metab acidosis P:   Dc lisinopril Avoid contrast , maintain euvolemia  GASTROINTESTINAL A:  Aspiration risk P:   Npo, await swallow eval  HEMATOLOGIC A:  Anemia, of chronic disease P:  monitor  INFECTIOUS A:  HCAP/aspiration P:   Empiric abx as above  ENDOCRINE A:  No issues   P:   CBG checks while npo  NEUROLOGIC A:  Poor mentals tatus - related to hypoxia & hyponatremia - improved Hard of hearig 4-8 more awake off Restoril  P:   Delerium avoidance measures   GLOBAL -  4-7 Discussed with daughter Karolee Stamps, no CPR , no intubation Full medical care, undertake reasonable medical measures to get him better. 4-8 consider tx out of SDU. PCCM to sign off   Brett Canales Minor ACNP [facility] Pager [phone] till 3 pm If no answer page [phone]  Independently examined pt, evaluated data & formulated above care plan with NP  [facility] V.  08/06/2012, 8:12 AM

## 2012-08-06 NOTE — Clinical Social Work Psychosocial (Signed)
Clinical Social Work Department BRIEF PSYCHOSOCIAL ASSESSMENT 08/06/2012  Patient:  Antonio Bradley, Antonio Bradley     Account Number:  [id number]     Admit date:  08/03/2012  Clinical Social Worker:  Jodelle Red  Date/Time:  08/06/2012 10:56 AM  Referred by:  CSW  Date Referred:  08/05/2012 Referred for  Other - See comment   Other Referral:   admitted from ALF   Interview type:  Family Other interview type:   chart review, discussion with rep at ALF    PSYCHOSOCIAL DATA Living Status:  FACILITY Admitted from facility:  [facility] [address] Level of care:  Assisted Living Primary support name:  Caleen Essex Primary support relationship to patient:  CHILD, ADULT Degree of support available:   good from son, Bud and daughter, Karolee Stamps    CURRENT CONCERNS Current Concerns  Post-Acute Placement   Other Concerns:   return to ALF    SOCIAL WORK ASSESSMENT / PLAN CSW spoke with daughter over the phone and Pt and son in 1222. Daughter and son would like Pt to return to his ALF at d/c and report Pt was doing well at ALF prior to admission. They are open to SNF if ALF cannot meet Pt's needs and would prefer [facility] if he needs SNF.  CSW spoke with ALF rep, Dalene Carrow who will come an assess Pt in the am. CSW sent clinicals as well. Family agrees to CSW following for return to ALF or possible SNF.   Assessment/plan status:  Psychosocial Support/Ongoing Assessment of Needs Other assessment/ plan:   assist with return to ALF or do new SNF.   Information/referral to community resources:    PATIENT'S/FAMILY'S RESPONSE TO PLAN OF CARE: Family coping adequately. Daughter states Pt was "very independent" at his ALF. She really would like him to return there, but is agreeable to SNF if needed. She agrees to CSW following for dispo.  ALF can step up care for the short term and will come to assess Pt in the am.    Doreen Salvage, LCSW ICU/Stepdown Clinical Social  Worker [facility] Cell [phone] Hours 8am-1200pm M-F

## 2012-08-06 NOTE — Evaluation (Signed)
Clinical/Bedside Swallow Evaluation Patient Details  Name: Antonio Bradley MRN: [id number] Date of Birth: 03-05-1913  Today's Date: 08/06/2012 Time: 0923-1008 SLP Time Calculation (min): 45 min  Past Medical History:  Past Medical History  Diagnosis Date  . ANEMIA, B12 DEFICIENCY 12/06/2006  . INSOMNIA, CHRONIC 05/22/2007  . PERIPHERAL AUTONOMIC NEUROPATHY D/O CLASS ELSW 10/27/2008  . HYPERTENSION 11/26/2006  . COPD 12/06/2006  . Slow transit constipation 05/22/2007  . OSTEOARTHROSIS, LOCAL NOS, LOWER LEG 12/12/2006  . Osteoarth NOS-Unspec 12/06/2006  . EDEMA LEG 09/28/2009  . MASS, LUNG 10/07/2009  . NON-HODGKIN'S LYMPHOMA 09/04/2007  . Prostate ca   . Non hodgkins lymphoma   . Pneumonia    Past Surgical History:  Past Surgical History  Procedure Laterality Date  . Hemorrhoid surgery    .  skin cancers    . Cataract extraction, bilateral     HPI:  Antonio Bradley is a 79 yo male adm to [facility] with respiratory issues - diaphoretic with moderate amount of sputum.  CXR showed slight worsening left lung airspace disease.  PMH + for recurrent falls, constipation, Cdif, HCAP, lymphoma 5-09, COPD.  Pt admits to having problems swallowing with occasional choking on liquids more than foods.  He also admits to having reflux symptoms last night.  He denies requiring Heimlich manuever and states he can normally feed himself.  CXR 08-05-12 slight worsening bilateral infiltrates.  Order received for BSE, pt now a partial code.     Assessment / Plan / Recommendation Clinical Impression  Pt without focal CN deficits therefore primary aspiration risk is due to his deconditioning and respiratory status, ? Retained secretions with decreased tolerance/decreased reserve.  Pt admits to h/o occasional choking on liquids more than foods- but this was not observed today and pt did not complain of this sensation.    Pt has cough at baseline and work of breathing increased to 26 with po intake.  Fortunately pt cough was productive  when wet gurgly breathing apparent and he was able to use Yankeur's suction to remove oral secretions with assistance.    Oral pocketing on right noted with cracker that clears with liquid swallows.   Nurse reports pt with oral pocketing of cereal on right this am.  Pt also states he has lingual pain, noted appearance of bruise on left lateral posterior tongue, ? source.    Rec continue diet except with ground meats with STRICT aspiration precautions.  SLP educated pt and son to tips to mitigate aspiration risk and chronicity of aspiration risk with advanced age, dyspnea with any effort etc.    Will follow for tolerance or indication of MBS - do not anticipate MBS would change pt's outcomes however.  Thanks for this consult.       Aspiration Risk  Moderate    Diet Recommendation Regular;Thin liquid (ground meats)   Liquid Administration via: Cup;Straw Medication Administration: Whole meds with puree (follow with water) Supervision: Staff feed patient;Trained caregiver to feed patient Compensations: Slow rate;Small sips/bites;Check for pocketing (rest breaks if dyspenic or coughing) Postural Changes and/or Swallow Maneuvers: Seated upright 90 degrees;Upright 30-60 min after meal    Other  Recommendations Oral Care Recommendations: Oral care before and after PO   Follow Up Recommendations   (TBD)    Frequency and Duration min 2x/week  2 weeks   Pertinent Vitals/Pain Afebrile, decreased, now partial code    SLP Swallow Goals Patient will utilize recommended strategies during swallow to increase swallowing safety with: Moderate assistance Goal #3: Pt  and family will verbalize chronic aspiration risk and tips to mitigate aspiration with min assist.    Swallow Study Prior Functional Status   see clinical impression    General Date of Onset: 07/30/12 HPI: Antonio Bradley is a 79 yo male adm to [facility] with respiratory issues - diaphoretic with moderate amount of sputum.  CXR showed slight worsening  left lung airspace disease.  PMH + for recurrent falls, constipation, Cdif, HCAP, lymphoma 5-09, COPD.  Pt admits to having problems swallowing with occasional choking on liquids more than foods.  He also admits to having reflux symptoms last night.  He denies requiring Heimlich manuever and states he can normally feed himself.  CXR 08-05-12 slight worsening bilateral infiltrates.  Order received for BSE, pt now a partial code.   Type of Study: Bedside swallow evaluation Previous Swallow Assessment: none Diet Prior to this Study: Regular;Thin liquids Temperature Spikes Noted: Yes Respiratory Status: Supplemental O2 delivered via (comment) (4 liters oxygen) History of Recent Intubation: No Behavior/Cognition: Alert;Cooperative;Pleasant mood Oral Cavity - Dentition: Adequate natural dentition Self-Feeding Abilities: Needs assist Patient Positioning: Upright in bed Baseline Vocal Quality: Clear Volitional Cough: Strong Volitional Swallow: Able to elicit    Oral/Motor/Sensory Function Overall Oral Motor/Sensory Function: Impaired (no focal CN deficits, generalized weakness)   Ice Chips Ice chips: Impaired Pharyngeal Phase Impairments: Cough - Immediate Other Comments: cough x1 of 2 ice chip boluses   Thin Liquid Thin Liquid: Within functional limits Presentation: Cup;Straw;Spoon;Self Fed Other Comments: hand over hand assist    Nectar Thick Nectar Thick Liquid: Within functional limits Presentation: Cup;Straw;Self Fed Other Comments: hand over hand assist   Honey Thick Honey Thick Liquid: Not tested   Puree Puree: Within functional limits   Solid   GO    Solid: Impaired Oral Phase Impairments: Impaired anterior to posterior transit Oral Phase Functional Implications: Oral residue;Oral holding;Right lateral sulci pocketing Pharyngeal Phase Impairments: Cough - Delayed Other Comments: cough with expectoration of secretions, ? blood tinged effective       Antonio Burnet, MS [facility]  SLP [phone]

## 2012-08-06 NOTE — Progress Notes (Signed)
[id number]/Kylinn Shropshire, RN, BSN, CCM:  CHART REVIEWED AND UPDATED.  Next chart review due on [id number]. NO DISCHARGE NEEDS PRESENT AT THIS TIME. CASE MANAGEMENT [phone] 

## 2012-08-06 NOTE — Progress Notes (Signed)
PROGRESS NOTE  Antonio Bradley ZOX:[id number] DOB: Mar 26, 1913 DOA: 08/03/2012 PCP: Marlowe Aschoff, FNP  Brief narrative: 79 yr old male admitted 4.5.14 from [facility] with cough, phlegm and temp 100.6 and found to have a PNA.  Running a 08/05/2012 patients not being significant rest church stress and blood gas and workup was indicative of acute PEG tube respiratory failure. He was transferred to the step down unit upon the critical care is consulted-was noted he had thick white secretions in his mouth. He became much more oriented and alert and seemed to respond a little better the next day.  Past medical history-As per Problem list Chart reviewed as below-  Admission 12.5.13 for HCAP, C Diff colitis  Sees vascular Dr. Arbie Cookey for pre-tibial ulceraton  Sees Dr. Truett Perna q 6 mo for Non-Hodgkins-last OV 08/2011   Multiple ED visits for FLu, fall c head laceration etc.etc.  Admission 5.14.09 for Bilat leg weakness-?2/2 to deconditioning and steroid myopathy  Consultants:  none  Procedures:  CXR 4.5.14. No acute findings. 2. Low lung volumes.  CT Angio 4.5.14 1. Nonspecific areas ofground-glass attenuation and atelectasis are noted in both lungs. 2. No specific features to suggest acute pulmonary embolus. 3. Pulmonary nodule in the right upper lobe measures 6 mm. 4. Borderline enlarged right paratracheal lymph nodes. Attention on follow-up imaging is advised.  Chest x-ray 08/05/2012= slight worsening of left lung airspace opacity  Antibiotics:  Cefepime 4.5.  Levaquin  4.5  Vancomycin 4.5  Blood cultures pending   Subjective  Had a fair night. Much more alert oriented and back to seeming baseline. States that his right hand pains him. States that this is not a new issue. Usually he has to be fed because of the debility secondary to this. He has been diagnosed with neuropathy of the right hand. Who is doing fair. Does not recall any of the is prior to today. Tolerating  nasal cannula 2-3 L   Objective    Objective: Filed Vitals:   08/06/12 0600 08/06/12 0700 08/06/12 0800 08/06/12 0817  BP: 131/51 128/45    Pulse: 87 86    Temp:   98.2 F (36.8 C)   TempSrc:   Oral   Resp: 25 23    Height:      Weight:      SpO2: 98% 97%  100%    Intake/Output Summary (Last 24 hours) at 08/06/12 0859 Last data filed at 08/06/12 0800  Gross per 24 hour  Intake    440 ml  Output    310 ml  Net    130 ml    Exam:  General: Alert.  Slightly HOH.  No apparent distress. Cardiovascular: s1 s2 ? M R side chest Respiratory:  Achets clearer but still has bilateral wet crackles Abdomen: soft NT/ND no rebound or gaurding Skin: Dressing to RLE removed 08/06/12 a.m. noted documentation but no significant ulcer or skin Neuro no focal deficit noted Right arm is tender to touch over the joints aren't  Data Reviewed: Basic Metabolic Panel:  Recent Labs Lab 08/03/12 0938 08/04/12 0528 08/05/12 0450 08/06/12 0330  NA 123* 127* 126* 127*  K 4.6 4.4 5.0 4.9  CL 92* 97 96 96  CO2 22 23 22 24   GLUCOSE 125* 120* 108* 101*  BUN 20 17 25* 33*  CREATININE 1.32 1.34 1.72* 1.81*  CALCIUM 8.9 8.7 8.6 8.8   Liver Function Tests:  Recent Labs Lab 08/03/12 0938  AST 24  ALT 20  ALKPHOS 60  BILITOT 0.3  PROT 6.5  ALBUMIN 3.3*   No results found for this basename: LIPASE, AMYLASE,  in the last 168 hours No results found for this basename: AMMONIA,  in the last 168 hours CBC:  Recent Labs Lab 08/03/12 0938 08/04/12 0528 08/05/12 0450 08/06/12 0330  WBC 13.2* 8.9 14.5* 11.6*  NEUTROABS 11.2*  --   --   --   HGB 10.9* 10.3* 9.9* 9.9*  HCT 32.0* 30.4* 29.0* 29.3*  MCV 90.4 91.8 92.4 91.3  PLT 222 202 203 204   Cardiac Enzymes:  Recent Labs Lab 08/03/12 0938  TROPONINI <0.30   BNP: No components found with this basename: POCBNP,  CBG:  Recent Labs Lab 08/05/12 2051  GLUCAP 108*    Recent Results (from the past 240 hour(s))  CULTURE, BLOOD  (ROUTINE X 2)     Status: None   Collection Time    08/03/12  9:42 AM      Result Value Range Status   Specimen Description BLOOD RIGHT ARM  4 ML IN [facility] BOTTLE   Final   Special Requests NONE   Final   Culture  Setup Time 08/03/2012 13:41   Final   Culture     Final   Value:        BLOOD CULTURE RECEIVED NO GROWTH TO DATE CULTURE WILL BE HELD FOR 5 DAYS BEFORE ISSUING A FINAL NEGATIVE REPORT   Report Status PENDING   Incomplete  CULTURE, EXPECTORATED SPUTUM-ASSESSMENT     Status: None   Collection Time    08/04/12  8:45 AM      Result Value Range Status   Specimen Description SPUTUM   Final   Special Requests NONE   Final   Sputum evaluation     Final   Value: THIS SPECIMEN IS ACCEPTABLE. RESPIRATORY CULTURE REPORT TO FOLLOW.   Report Status 08/04/2012 FINAL   Final  CULTURE, RESPIRATORY (NON-EXPECTORATED)     Status: None   Collection Time    08/04/12  8:45 AM      Result Value Range Status   Specimen Description SPUTUM   Final   Special Requests NONE   Final   Gram Stain     Final   Value: ABUNDANT WBC PRESENT, PREDOMINANTLY PMN     RARE SQUAMOUS EPITHELIAL CELLS PRESENT     FEW GRAM POSITIVE RODS     FEW GRAM NEGATIVE RODS     FEW GRAM POSITIVE COCCI   Culture NORMAL OROPHARYNGEAL FLORA   Final   Report Status 08/06/2012 FINAL   Final  MRSA PCR SCREENING     Status: None   Collection Time    08/05/12 12:22 PM      Result Value Range Status   MRSA by PCR NEGATIVE  NEGATIVE Final   Comment:            The GeneXpert MRSA Assay (FDA     approved for NASAL specimens     only), is one component of a     comprehensive MRSA colonization     surveillance program. It is not     intended to diagnose MRSA     infection nor to guide or     monitor treatment for     MRSA infections.  CULTURE, BLOOD (ROUTINE X 2)     Status: None   Collection Time    08/05/12  5:48 PM      Result Value Range Status   Specimen Description BLOOD RIGHT HAND  Final   Special Requests BOTTLES  DRAWN AEROBIC AND ANAEROBIC 10CC   Final   Culture  Setup Time 08/05/2012 22:46   Final   Culture     Final   Value:        BLOOD CULTURE RECEIVED NO GROWTH TO DATE CULTURE WILL BE HELD FOR 5 DAYS BEFORE ISSUING A FINAL NEGATIVE REPORT   Report Status PENDING   Incomplete  CULTURE, BLOOD (ROUTINE X 2)     Status: None   Collection Time    08/05/12  5:50 PM      Result Value Range Status   Specimen Description BLOOD RIGHT ARM   Final   Special Requests BOTTLES DRAWN AEROBIC AND ANAEROBIC 10CC   Final   Culture  Setup Time 08/05/2012 22:46   Final   Culture     Final   Value:        BLOOD CULTURE RECEIVED NO GROWTH TO DATE CULTURE WILL BE HELD FOR 5 DAYS BEFORE ISSUING A FINAL NEGATIVE REPORT   Report Status PENDING   Incomplete     Studies:              All Imaging reviewed and is as per above notation   Scheduled Meds: . ceFEPime (MAXIPIME) IV  1 g Intravenous Q24H  . enoxaparin (LOVENOX) injection  30 mg Subcutaneous Q24H  . guaiFENesin  1,200 mg Oral BID  . levalbuterol  0.63 mg Nebulization Q6H  . metoprolol  2.5 mg Intravenous Q6H  . vancomycin  500 mg Intravenous Q24H   Continuous Infusions:    Assessment /Plan: 1. Resolved acute ty 2 respiratory failure secondary to #2-chest x-ray portable done 4/7 AM shows worsening opacities L side.  See #2. Appreciate critical care assistance 2. HCAP vs Potentital aspiration PNA-Continue Broad spectrum Abx Cefepime/levaquin/vanco-strep Pneumo neg, Legionellea neg.  Blood cultures P.  white count trending down from 14.5-11.6 on 08/06/12 Likely aspiration-noted copious secretions-flutter valve added 08/06/12, pulmonary toilet every 4 when necessary-he seems to be on Mucinex 1200 twice a day which will be reordered 3. Hyponatremia-likely euvolemic hyponatremia-urine sodium within regular range so probably more associated with poor by mouth intake and low solute diet-could also be secondary to Lasix use from.  Continue IV fluids 50 cc per  hour 4. Acute  Kidney injury/CKd 1-seems to be plateauing. Continue IV fluids. 5. Prior h/o C diff-will attempt to minimize duration abx 6. Htn-Mod controlled only.  Ace d/c 4.7 and Metoprolol 12.5 bid  Add another agent if persisiting above 160/90 7. H/o Pre-tibial ulcer.  Seems very stable and does require probably minimal dressingsd 8. Insomnia-Continue Restoril 30 changed to 15 qhs 4.8, as this may worsen snoring and cause aspiration at night 9. r hand pain-probably secondary to neuropathy-gabapentin 300 twice a day reordered as was n.p.o. on 08/05/12-consider up titration to tid as an outpatient 10. H/o Prostate Ca-ON Lupro needs to be re-started as an out-patient.  Continue Flomax 0.4 mg daily 11. HLd-continue Zocor 40 qhs  Code Status: Currently DNR Family Communication: Caleen Essex Daughter [phone] was called but no answer noted 4.8.14 Disposition Plan: inpatient-PT/OT to consult-probably needs return to SNF on d/c   Pleas Koch, MD  [facility] Pager [phone] 08/06/2012, 8:59 AM    LOS: 3 days

## 2012-08-06 NOTE — Progress Notes (Signed)
Physical Therapy Treatment Patient Details Name: Antonio Bradley MRN: [id number] DOB: [birth year] Today's Date: 08/06/2012 Time: 7829-5621 PT Time Calculation (min): 32 min  PT Assessment / Plan / Recommendation Comments on Treatment Session  Pt able to ambulate today twice with rest break in between.  He requires +2 assist at this time for safety and line management.  Ambulated on 3L O2 with SaO2 difficult to assess due to gripping RW.  No noted SOB during amb.  Also encouraged pt to cough and attempt to expel mucous if possible.     Follow Up Recommendations  Home health PT;SNF;Supervision/Assistance - 24 hour (if ALF can provide amount of assist needed)     Does the patient have the potential to tolerate intense rehabilitation     Barriers to Discharge        Equipment Recommendations  Rolling walker with 5" wheels    Recommendations for Other Services OT consult  Frequency Min 3X/week   Plan Discharge plan remains appropriate;Discharge plan needs to be updated    Precautions / Restrictions Precautions Precautions: Fall Restrictions Weight Bearing Restrictions: No   Pertinent Vitals/Pain Pt states no pain    Mobility  Bed Mobility Bed Mobility: Not assessed (Pt in recliner when PT/OT arrived. ) Transfers Transfers: Sit to Stand;Stand to Sit Sit to Stand: 1: +2 Total assist;From chair/3-in-1;With armrests Sit to Stand: Patient Percentage: 50% Stand to Sit: 1: +2 Total assist Stand to Sit: Patient Percentage: 50% Details for Transfer Assistance: Assist to scoot to EOB and back in chair due to peripheral neuropathy in hands.  Also required assist to rise, steady and ensure controlled descent.  Max cues for hand placement and safety when sitting/standing.  Ambulation/Gait Ambulation/Gait Assistance: 1: +2 Total assist Ambulation/Gait: Patient Percentage: 60% Ambulation Distance (Feet): 12 Feet (then another 15') Assistive device: Rolling walker Ambulation/Gait Assistance  Details: Requires assist to steady throughout and for maintaining upright posture with cues for sequencing/technique with RW, pursed lip breathing and upright posture.  Gait Pattern: Step-to pattern;Decreased step length - right;Decreased step length - left;Trunk flexed Gait velocity: decreased  General Gait Details: pt with very limited endurance.     Exercises     PT Diagnosis:    PT Problem List:   PT Treatment Interventions:     PT Goals Acute Rehab PT Goals PT Goal Formulation: With patient Time For Goal Achievement: 08/18/12 Potential to Achieve Goals: Good Pt will go Sit to Stand: with mod assist PT Goal: Sit to Stand - Progress: Progressing toward goal Pt will Ambulate: 1 - 15 feet;with rolling walker;with min assist PT Goal: Ambulate - Progress: Progressing toward goal  Visit Information  Last PT Received On: 08/06/12 Assistance Needed: +2    Subjective Data  Subjective: I'm doing, but I don't know about the good.  Patient Stated Goal: return to prior level of function-walking to dining room with RW   Cognition  Cognition Overall Cognitive Status: Appears within functional limits for tasks assessed/performed Arousal/Alertness: Awake/alert Orientation Level: Appears intact for tasks assessed Behavior During Session: [facility] for tasks performed    Balance  Static Sitting Balance Static Sitting - Balance Support: No upper extremity supported Static Sitting - Level of Assistance: 5: Stand by assistance  End of Session PT - End of Session Equipment Utilized During Treatment: Gait belt;Oxygen (3LO2) Activity Tolerance: Patient limited by fatigue Patient left: in chair;with call bell/phone within reach;with family/visitor present Nurse Communication: Mobility status   GP     Vista Deck 08/06/2012,  1:38 PM

## 2012-08-06 NOTE — Progress Notes (Signed)
Occupational Therapy Treatment Patient Details Name: Antonio Bradley MRN: [id number] DOB: [birth year] Today's Date: 08/06/2012 Time: 4098-1191 OT Time Calculation (min): 31 min  OT Assessment / Plan / Recommendation Comments on Treatment Session Pt's balance improved from time of evaluation.  Pt continues to need A x 2 for sit to stand.      Follow Up Recommendations  SNF (vs. ALF--if they can provide enough assistance)    Barriers to Discharge       Equipment Recommendations       Recommendations for Other Services    Frequency Min 2X/week   Plan      Precautions / Restrictions Precautions Precautions: Fall Restrictions Weight Bearing Restrictions: No   Pertinent Vitals/Pain No c/o pain    ADL  Grooming: Performed;Minimal assistance;Brushing hair--for task.  Pt able to hold and manipulate comb handle Where Assessed - Grooming: Unsupported sitting Toilet Transfer: +2 Total assistance Toilet Transfer: Patient Percentage: 60% Toilet Transfer Method: Sit to stand Equipment Used: Gait belt;Rolling walker Transfers/Ambulation Related to ADLs: Pt with decreased posterior lean from time of eval:  tends to lean forward--kyphotic spine.  Ambulated to hall with rest break:  A x 2 for safety and pt needs A x 2 to rise from chair ADL Comments: Daughter present and states pt has peripheral neuropathy.  He has built up utensils at [employer].  Provided foam to use in the hospital    OT Diagnosis:    OT Problem List:   OT Treatment Interventions:     OT Goals Acute Rehab OT Goals Time For Goal Achievement: 08/18/12 ADL Goals Pt Will Transfer to Toilet: with mod assist;Ambulation;3-in-1 ADL Goal: Toilet Transfer - Progress: Progressing toward goals Miscellaneous OT Goals Miscellaneous OT Goal #2: pt will perform self feeding with set up only using built up foam on utensils OT Goal: Miscellaneous Goal #2 - Progress: Goal set today Miscellaneous OT Goal #3: Pt will tolerate 5 minutes  of static activity in unsupported sitting with supervision OT Goal: Miscellaneous Goal #3 - Progress: Goal set today  Visit Information  Last OT Received On: 08/06/12 Assistance Needed: +2 PT/OT Co-Evaluation/Treatment: Yes    Subjective Data      Prior Functioning       Cognition  Cognition Overall Cognitive Status: Appears within functional limits for tasks assessed/performed Arousal/Alertness: Awake/alert Orientation Level: Appears intact for tasks assessed Behavior During Session: [facility] for tasks performed    Mobility  Bed Mobility Bed Mobility: Not assessed (Pt in recliner when PT/OT arrived. ) Transfers Sit to Stand: 1: +2 Total assist;From chair/3-in-1;With armrests Sit to Stand: Patient Percentage: 50% Stand to Sit: 1: +2 Total assist Stand to Sit: Patient Percentage: 50% Details for Transfer Assistance: Assist to scoot to EOB and back in chair due to peripheral neuropathy in hands.  Also required assist to rise, steady and ensure controlled descent.  Max cues for hand placement and safety when sitting/standing.     Exercises      Balance Static Sitting Balance Static Sitting - Balance Support: No upper extremity supported Static Sitting - Level of Assistance: 5: Stand by assistance   End of Session OT - End of Session Activity Tolerance: Patient tolerated treatment well Patient left: in chair;with call bell/phone within reach;with family/visitor present;with nursing in room  GO     Antonio Bradley 08/06/2012, 2:27 PM  Antonio Bradley, OTR/L [phone] 08/06/2012

## 2012-08-07 DIAGNOSIS — J449 Chronic obstructive pulmonary disease, unspecified: Secondary | ICD-10-CM

## 2012-08-07 DIAGNOSIS — G909 Disorder of the autonomic nervous system, unspecified: Secondary | ICD-10-CM

## 2012-08-07 LAB — BASIC METABOLIC PANEL
Chloride: 99 mEq/L (ref 96–112)
Creatinine, Ser: 1.55 mg/dL — ABNORMAL HIGH (ref 0.50–1.35)
GFR calc Af Amer: 41 mL/min — ABNORMAL LOW (ref >90)
Potassium: 4.8 mEq/L (ref 3.5–5.1)
Sodium: 131 mEq/L — ABNORMAL LOW (ref 135–145)

## 2012-08-07 LAB — CBC
Platelets: 198 10*3/uL (ref 150–400)
RDW: 14.3 % (ref 11.5–15.5)
WBC: 9 10*3/uL (ref 4.0–10.5)

## 2012-08-07 LAB — GLUCOSE, CAPILLARY: Glucose-Capillary: 105 mg/dL — ABNORMAL HIGH (ref 70–99)

## 2012-08-07 MED ORDER — GABAPENTIN 100 MG PO CAPS
100.0000 mg | ORAL_CAPSULE | Freq: Two times a day (BID) | ORAL | Status: DC
  Administered 2012-08-07 – 2012-08-09 (×4): 100 mg via ORAL
  Filled 2012-08-07 (×5): qty 1

## 2012-08-07 MED ORDER — AMOXICILLIN-POT CLAVULANATE 500-125 MG PO TABS
500.0000 mg | ORAL_TABLET | Freq: Two times a day (BID) | ORAL | Status: DC
  Administered 2012-08-07 – 2012-08-09 (×5): 500 mg via ORAL
  Filled 2012-08-07 (×6): qty 1

## 2012-08-07 MED ORDER — TEMAZEPAM 7.5 MG PO CAPS
7.5000 mg | ORAL_CAPSULE | Freq: Every day | ORAL | Status: DC
Start: 2012-08-07 — End: 2012-08-09
  Administered 2012-08-07: 7.5 mg via ORAL
  Filled 2012-08-07 (×2): qty 1

## 2012-08-07 MED ORDER — SACCHAROMYCES BOULARDII 250 MG PO CAPS
250.0000 mg | ORAL_CAPSULE | Freq: Two times a day (BID) | ORAL | Status: DC
  Administered 2012-08-07 – 2012-08-09 (×5): 250 mg via ORAL
  Filled 2012-08-07 (×6): qty 1

## 2012-08-07 NOTE — Progress Notes (Signed)
Speech Language Pathology Dysphagia Treatment Patient Details Name: Antonio Bradley MRN: [id number] DOB: 10-10-1912 Today's Date: 08/07/2012 Time: 4098-1191 SLP Time Calculation (min): 21 min  Assessment / Plan / Recommendation Clinical Impression  Pt seen for skilled dysphagia tx to determine tolerance of po diet, use of compensatory strategies and for pt education.  Pt was asleep upon SLP entrance to room but did awaken with moderate verbal/tactile stimulation.  Pt with congested breathing quality that is diminished with CUED cough and CUED expectoration via Yankeur's suction.  Pt observed with muffin, coffee, water, Ensure and pills crushed with applesauce *pills given by RN.  Decr oral coordination, delayed oral transiting and suspect delayed swallow but no overt clinical indications of aspiration.  Pt's aspiration risk will remain high given his gross weakness, advanced age, reliance on others for feeding and current medical issue.   Intake documented as 50%, afebrile status, CXR 4/7 slightly worsening left lung opacity.    Due to level of dysphagia, adequate nutritional intake may be laborious for pt to achieve, ? if family/pt would benefit from palliative consult to establish goals of care.  Pt was disoriented to place today, stating "where am I at?"  Rec continue diet with strict aspiration precautions to mitigate risk.  No family present currently to educate.  SLP will follow to aid in dysphagia management.      Diet Recommendation  Continue with Current Diet: Regular;Thin liquid (ground meats)    SLP Plan Continue with current plan of care   Pertinent Vitals/Pain See clinical impressions   Swallowing Goals  SLP Swallowing Goals Swallow Study Goal #2 - Progress: Progressing toward goal Swallow Study Goal #3 - Progress: Progressing toward goal  General Temperature Spikes Noted: No Respiratory Status: Supplemental O2 delivered via (comment) Behavior/Cognition: Confused;Hard of  hearing;Decreased sustained attention;Alert;Cooperative;Pleasant mood Oral Cavity - Dentition: Adequate natural dentition Patient Positioning: Upright in bed  Oral Cavity - Oral Hygiene   oral cavity clear  Dysphagia Treatment Treatment focused on: Skilled observation of diet tolerance;Patient/family/caregiver education Treatment Methods/Modalities: Skilled observation Patient observed directly with PO's: Yes Type of PO's observed: Thin liquids;Dysphagia 3 (soft) Feeding: Total assist Liquids provided via: Straw;Cup Oral Phase Signs & Symptoms: Prolonged oral phase;Prolonged bolus formation;Prolonged mastication Pharyngeal Phase Signs & Symptoms: Suspected delayed swallow initiation Type of cueing: Visual;Verbal Amount of cueing: Total   GO     [name], MS [facility] SLP [phone]

## 2012-08-07 NOTE — Progress Notes (Signed)
Occupational Therapy Treatment Patient Details Name: Auther Lyerly Bovard MRN: [id number] DOB: August 29, 1912 Today's Date: 08/07/2012 Time: 1326-1400 OT Time Calculation (min): 34 min  OT Assessment / Plan / Recommendation Comments on Treatment Session Pt continues to participate in therapy, +2 needed for any mobility.      Follow Up Recommendations  SNF (unless ALF can provide needed assistance)    Barriers to Discharge       Equipment Recommendations       Recommendations for Other Services    Frequency Min 2X/week   Plan      Precautions / Restrictions Precautions Precautions: Fall Restrictions Weight Bearing Restrictions: No   Pertinent Vitals/Pain No pain.  Sats 97% 1 liter at rest.  91% after ambulating towards sink and 95% after 1 minute of recovery    ADL  Grooming: Teeth care;Minimal assistance (to manipulate emesis basin and cup with full set up) Where Assessed - Grooming: Unsupported sitting Toilet Transfer: +2 Total assistance Toilet Transfer: Patient Percentage: 60% Toilet Transfer Method: Sit to stand Equipment Used: Gait belt;Rolling walker Transfers/Ambulation Related to ADLs: ambulated 10 feet towards sink ADL Comments: sat unsupported EOB x 5 minutes to brush teeth.  Used suction when coughing with min A to set up.  Used urinal in standing with total A.    OT Diagnosis:    OT Problem List:   OT Treatment Interventions:     OT Goals Acute Rehab OT Goals Time For Goal Achievement: 08/18/12 ADL Goals Pt Will Transfer to Toilet: with mod assist;Ambulation;3-in-1 ADL Goal: Toilet Transfer - Progress: Progressing toward goals (slowly) Miscellaneous OT Goals Miscellaneous OT Goal #1: Pt will complete bed mobility Min (A) as precursor to adls OT Goal: Miscellaneous Goal #1 - Progress: Not progressing Miscellaneous OT Goal #3: Pt will tolerate 5 minutes of static activity in unsupported sitting with supervision OT Goal: Miscellaneous Goal #3 - Progress:  Met  Visit Information  Last OT Received On: 08/07/12 Assistance Needed: +2 PT/OT Co-Evaluation/Treatment: Yes    Subjective Data      Prior Functioning       Cognition  Cognition Overall Cognitive Status: Appears within functional limits for tasks assessed/performed Behavior During Session: [facility] for tasks performed    Mobility  Bed Mobility Supine to Sit: 1: +2 Total assist Supine to Sit: Patient Percentage: 20% Details for Bed Mobility Assistance: used pad to assist Transfers Sit to Stand: 1: +2 Total assist;From chair/3-in-1;With armrests Sit to Stand: Patient Percentage: 50% Details for Transfer Assistance: assist to rise from bed and cues for UE/LE placement    Exercises      Balance Static Sitting Balance Static Sitting - Level of Assistance: 5: Stand by assistance (supervision)   End of Session OT - End of Session Activity Tolerance: Patient tolerated treatment well Patient left: in chair;with call bell/phone within reach;with family/visitor present  GO     Anfernee Peschke 08/07/2012, 2:18 PM [name], OTR/L [phone] 08/07/2012

## 2012-08-07 NOTE — Progress Notes (Signed)
CSW spoke with Domingo Sep @ [address] [facility] (ph#: [phone]), confirmed that they would be able to take her back when ready. Anticipating discharge Thursday/Friday. CSW will follow-up tomorrow.   Unice Bailey, LCSW [facility] Clinical Social Worker cell #: [phone]

## 2012-08-07 NOTE — Progress Notes (Signed)
PROGRESS NOTE  Antonio Bradley RUE:[id number] DOB: 02-28-1913 DOA: 08/03/2012 PCP: Marlowe Aschoff, FNP  Brief narrative: 79 yr old male admitted 4.5.14 from [facility] with cough, phlegm and temp 100.6 and found to have a PNA  And acute resp failure. He was transferred to the step down unit   Assessment /Plan: 1. Acute respiratory failure secondary to #2, improving, Appreciate critical care assistance  2. HCAP vs Potentital aspiration PNA- -Day 4 of Cefepime/levaquin/vanco -strep Pneumo neg, Legionellea neg.  Blood cultures NGTD -Transition to PO Augmentin 4/9 -Pulm Toilet, flutter valve, xopenex nebs and Mucinex 1200 twice a day   3. Hyponatremia-likely euvolemic hyponatremia- -improved, stop IVF  4. Acute  Kidney injury/CKd 1-slowly improving -Stop IV fluids.  5. Prior h/o C diff-monitor, add floraster  6. Htn-Mod controlled only.  Ace d/c 4.7 and Metoprolol 12.5 bid  Stable  7. H/o Pre-tibial ulcer.  Seems very stable, wound care   8. Insomnia-Cut down Restoril 30 changed to 7.5mg  qhs   9. R hand pain-probably secondary to neuropathy-gabapentin 300 twice a day was ordered, will cut down dose due to drowsiness  10. H/o Prostate Ca-ON Lupro needs to be re-started as an out-patient.  Continue Flomax 0.4 mg daily  11. HLd-continue Zocor 40 qhs  Code Status: Partial Code Family Communication: none at bedside Disposition Plan: SNF vs ALF in 1-2 days   Zannie Cove, MD  [facility] Pager [phone]    Consultants:  PCCM  Procedures:  CXR 4.5.14. No acute findings. 2. Low lung volumes.  CT Angio 4.5.14 1. Nonspecific areas ofground-glass attenuation and atelectasis are noted in both lungs. 2. No specific features to suggest acute pulmonary embolus. 3. Pulmonary nodule in the right upper lobe measures 6 mm. 4. Borderline enlarged right paratracheal lymph nodes. Attention on follow-up imaging is advised.  Chest x-ray 08/05/2012= slight worsening  of left lung airspace opacity  Antibiotics:  Cefepime 4.5.-4/9  Levaquin  4.5-4/9  Vancomycin 4.5-4/9  Augmentin 4/9  Blood cultures pending   Subjective  Had a fair night. Much more alert oriented and back to seeming baseline. States that his right hand pains him. States that this is not a new issue. Usually he has to be fed because of the debility secondary to this. He has been diagnosed with neuropathy of the right hand. Who is doing fair. Does not recall any of the is prior to today. Tolerating nasal cannula 2-3 L   Objective    Objective: Filed Vitals:   08/06/12 2227 08/07/12 0130 08/07/12 0536 08/07/12 0743  BP: 163/57  142/61   Pulse: 95  77   Temp: 98.5 F (36.9 C)  98.9 F (37.2 C)   TempSrc: Oral  Oral   Resp: 22 24 25    Height:      Weight:      SpO2: 97%  97% 97%    Intake/Output Summary (Last 24 hours) at 08/07/12 1013 Last data filed at 08/07/12 0500  Gross per 24 hour  Intake 1583.33 ml  Output    975 ml  Net 608.33 ml    Exam:  General: Alert.  Slightly HOH.  No apparent distress. Cardiovascular: s1 s2 ? M R side chest Respiratory:  Achets clearer but still has bilateral wet crackles Abdomen: soft NT/ND no rebound or gaurding Skin: Dressing to RLE removed 08/06/12 a.m. noted documentation but no significant ulcer or skin Neuro no focal deficit noted Right arm is tender to touch over the joints aren't  Data Reviewed: Basic  Metabolic Panel:  Recent Labs Lab 08/03/12 0938 08/04/12 0528 08/05/12 0450 08/06/12 0330 08/07/12 0449  NA 123* 127* 126* 127* 131*  K 4.6 4.4 5.0 4.9 4.8  CL 92* 97 96 96 99  CO2 22 23 22 24 25   GLUCOSE 125* 120* 108* 101* 103*  BUN 20 17 25* 33* 37*  CREATININE 1.32 1.34 1.72* 1.81* 1.55*  CALCIUM 8.9 8.7 8.6 8.8 8.8   Liver Function Tests:  Recent Labs Lab 08/03/12 0938  AST 24  ALT 20  ALKPHOS 60  BILITOT 0.3  PROT 6.5  ALBUMIN 3.3*   No results found for this basename: LIPASE, AMYLASE,  in  the last 168 hours No results found for this basename: AMMONIA,  in the last 168 hours CBC:  Recent Labs Lab 08/03/12 0938 08/04/12 0528 08/05/12 0450 08/06/12 0330 08/07/12 0449  WBC 13.2* 8.9 14.5* 11.6* 9.0  NEUTROABS 11.2*  --   --   --   --   HGB 10.9* 10.3* 9.9* 9.9* 9.4*  HCT 32.0* 30.4* 29.0* 29.3* 27.8*  MCV 90.4 91.8 92.4 91.3 91.7  PLT 222 202 203 204 198   Cardiac Enzymes:  Recent Labs Lab 08/03/12 0938  TROPONINI <0.30   BNP: No components found with this basename: POCBNP,  CBG:  Recent Labs Lab 08/05/12 2051 08/06/12 2322  GLUCAP 108* 105*    Recent Results (from the past 240 hour(s))  CULTURE, BLOOD (ROUTINE X 2)     Status: None   Collection Time    08/03/12  9:25 AM      Result Value Range Status   Specimen Description BLOOD RIGHT HAND  5 ML IN [facility] BOTTLE   Final   Special Requests NONE   Final   Culture  Setup Time 08/04/2012 00:00   Final   Culture     Final   Value:        BLOOD CULTURE RECEIVED NO GROWTH TO DATE CULTURE WILL BE HELD FOR 5 DAYS BEFORE ISSUING A FINAL NEGATIVE REPORT   Report Status PENDING   Incomplete  CULTURE, BLOOD (ROUTINE X 2)     Status: None   Collection Time    08/03/12  9:42 AM      Result Value Range Status   Specimen Description BLOOD RIGHT ARM  4 ML IN [facility] BOTTLE   Final   Special Requests NONE   Final   Culture  Setup Time 08/03/2012 13:41   Final   Culture     Final   Value:        BLOOD CULTURE RECEIVED NO GROWTH TO DATE CULTURE WILL BE HELD FOR 5 DAYS BEFORE ISSUING A FINAL NEGATIVE REPORT   Report Status PENDING   Incomplete  CULTURE, EXPECTORATED SPUTUM-ASSESSMENT     Status: None   Collection Time    08/04/12  8:45 AM      Result Value Range Status   Specimen Description SPUTUM   Final   Special Requests NONE   Final   Sputum evaluation     Final   Value: THIS SPECIMEN IS ACCEPTABLE. RESPIRATORY CULTURE REPORT TO FOLLOW.   Report Status 08/04/2012 FINAL   Final  CULTURE, RESPIRATORY  (NON-EXPECTORATED)     Status: None   Collection Time    08/04/12  8:45 AM      Result Value Range Status   Specimen Description SPUTUM   Final   Special Requests NONE   Final   Gram Stain  Final   Value: ABUNDANT WBC PRESENT, PREDOMINANTLY PMN     RARE SQUAMOUS EPITHELIAL CELLS PRESENT     FEW GRAM POSITIVE RODS     FEW GRAM NEGATIVE RODS     FEW GRAM POSITIVE COCCI   Culture NORMAL OROPHARYNGEAL FLORA   Final   Report Status 08/06/2012 FINAL   Final  MRSA PCR SCREENING     Status: None   Collection Time    08/05/12 12:22 PM      Result Value Range Status   MRSA by PCR NEGATIVE  NEGATIVE Final   Comment:            The GeneXpert MRSA Assay (FDA     approved for NASAL specimens     only), is one component of a     comprehensive MRSA colonization     surveillance program. It is not     intended to diagnose MRSA     infection nor to guide or     monitor treatment for     MRSA infections.  URINE CULTURE     Status: None   Collection Time    08/05/12  5:27 PM      Result Value Range Status   Specimen Description URINE, CLEAN CATCH   Final   Special Requests NONE   Final   Culture  Setup Time 08/05/2012 23:16   Final   Colony Count NO GROWTH   Final   Culture NO GROWTH   Final   Report Status 08/06/2012 FINAL   Final  CULTURE, BLOOD (ROUTINE X 2)     Status: None   Collection Time    08/05/12  5:48 PM      Result Value Range Status   Specimen Description BLOOD RIGHT HAND   Final   Special Requests BOTTLES DRAWN AEROBIC AND ANAEROBIC 10CC   Final   Culture  Setup Time 08/05/2012 22:46   Final   Culture     Final   Value:        BLOOD CULTURE RECEIVED NO GROWTH TO DATE CULTURE WILL BE HELD FOR 5 DAYS BEFORE ISSUING A FINAL NEGATIVE REPORT   Report Status PENDING   Incomplete  CULTURE, BLOOD (ROUTINE X 2)     Status: None   Collection Time    08/05/12  5:50 PM      Result Value Range Status   Specimen Description BLOOD RIGHT ARM   Final   Special Requests BOTTLES  DRAWN AEROBIC AND ANAEROBIC 10CC   Final   Culture  Setup Time 08/05/2012 22:46   Final   Culture     Final   Value:        BLOOD CULTURE RECEIVED NO GROWTH TO DATE CULTURE WILL BE HELD FOR 5 DAYS BEFORE ISSUING A FINAL NEGATIVE REPORT   Report Status PENDING   Incomplete     Studies:              All Imaging reviewed and is as per above notation   Scheduled Meds: . enoxaparin (LOVENOX) injection  30 mg Subcutaneous Q24H  . feeding supplement  237 mL Oral q morning - 10a  . gabapentin  300 mg Oral BID  . guaiFENesin  1,200 mg Oral BID  . levalbuterol  0.63 mg Nebulization Q6H  . metoprolol  2.5 mg Intravenous Q6H  . tamsulosin  0.4 mg Oral Daily  . temazepam  7.5 mg Oral QHS   Continuous Infusions:    08/07/2012, 10:13 AM  LOS: 4 days

## 2012-08-08 LAB — BASIC METABOLIC PANEL
BUN: 34 mg/dL — ABNORMAL HIGH (ref 6–23)
Calcium: 8.9 mg/dL (ref 8.4–10.5)
GFR calc non Af Amer: 43 mL/min — ABNORMAL LOW (ref >90)
Glucose, Bld: 110 mg/dL — ABNORMAL HIGH (ref 70–99)

## 2012-08-08 LAB — CBC
Hemoglobin: 9.9 g/dL — ABNORMAL LOW (ref 13.0–17.0)
MCHC: 33.9 g/dL (ref 30.0–36.0)
RBC: 3.2 MIL/uL — ABNORMAL LOW (ref 4.22–5.81)

## 2012-08-08 MED ORDER — LEVALBUTEROL HCL 0.63 MG/3ML IN NEBU
0.6300 mg | INHALATION_SOLUTION | RESPIRATORY_TRACT | Status: DC | PRN
  Administered 2012-08-09: 0.63 mg via RESPIRATORY_TRACT
  Filled 2012-08-08: qty 3

## 2012-08-08 NOTE — Progress Notes (Signed)
Pt's daughter expressed concerns about pt not being himself and being drowsy. She questioned if we needed to do a CT scan to check for a stroke. I completed a neuro evaluation at the bedside and found no indication of weakness or stroke symptoms. MD made aware of my daughters concerns and my findings, and MD states she will call daughter to discuss concerns. Will continue to monitor pt.   Arta Bruce [facility] 08/08/2012 4:58 PM

## 2012-08-08 NOTE — Progress Notes (Signed)
Physical Therapy Treatment Patient Details Name: Kashis Penley Gauna MRN: [id number] DOB: 1912/11/10 Today's Date: 08/08/2012 Time: 4098-1191 PT Time Calculation (min): 34 min  PT Assessment / Plan / Recommendation Comments on Treatment Session  Very slow moving. Requires + 2 total assist.  Limited amb distance.  HIGH FALL RISK.  Assisted pt OOB to [facility] for small BM then amb.  Pt required increased time and Total Assist + 2. Daughter present and observed. Pt resided at ALF and walking to meals, stated daughter.      Follow Up Recommendations  Home health PT;SNF;Supervision/Assistance - 24 hour (ALF stated they can provide appropriate care (noted in chart)     Does the patient have the potential to tolerate intense rehabilitation     Barriers to Discharge        Equipment Recommendations  Rolling walker with 5" wheels    Recommendations for Other Services    Frequency Min 3X/week   Plan      Precautions / Restrictions Precautions Precautions: Fall Restrictions Weight Bearing Restrictions: No   Pertinent Vitals/Pain No c/o pain    Mobility  Bed Mobility Bed Mobility: Supine to Sit;Sitting - Scoot to Edge of Bed Supine to Sit: 1: +2 Total assist Supine to Sit: Patient Percentage: 20% Sitting - Scoot to Edge of Bed: 1: +2 Total assist Sitting - Scoot to Edge of Bed: Patient Percentage: 10% Details for Bed Mobility Assistance: used pad to assist and HOB elevated 45 Transfers Transfers: Sit to Stand Sit to Stand: 1: +2 Total assist;From bed;From elevated surface Sit to Stand: Patient Percentage: 40% Stand to Sit: 1: +2 Total assist Stand to Sit: Patient Percentage: 40% Details for Transfer Assistance: assist to rise from bed and cues for UE/LE placement Ambulation/Gait Ambulation/Gait Assistance: 1: +2 Total assist Ambulation/Gait: Patient Percentage: 50% Ambulation Distance (Feet): 12 Feet Assistive device: Rolling walker Ambulation/Gait Assistance Details: increased time  plus very short shuffled steps with poor posture of flex hips and knees.  RA 95% with HR 124 Gait Pattern: Step-to pattern;Decreased step length - right;Decreased step length - left;Trunk flexed Gait velocity: decreased  General Gait Details: pt with very limited endurance.      PT Goals                                                            Progressing slowly    Visit Information  Last PT Received On: 08/08/12 Assistance Needed: +1    Subjective Data  Subjective: Who are you   Cognition    impaired   Balance   poor  End of Session PT - End of Session Equipment Utilized During Treatment: Gait belt Activity Tolerance: Patient limited by fatigue Patient left: in chair;with call bell/phone within reach;with family/visitor present Nurse Communication: Need for lift equipment   Felecia Shelling  PTA [facility]  Acute  Rehab Pager      [phone]

## 2012-08-08 NOTE — Progress Notes (Signed)
PROGRESS NOTE  Antonio Bradley ZOX:[id number] DOB: [birth year] DOA: 08/03/2012 PCP: Marlowe Aschoff, FNP  Brief narrative: 79 yr old male admitted 4.5.14 from [facility] with cough, phlegm and temp 100.6 and found to have a PNA  And acute resp failure. He was transferred to the step down unit   Assessment /Plan: 1. Acute respiratory failure secondary to #2, -improving,  -Appreciate critical care assistance  2. HCAP vs Aspiration PNA- -s/p 4 days of Cefepime/levaquin/vanco -strep Pneumo neg, Legionellea neg.  Blood cultures NGTD -Transitioned to PO Augmentin 4/9 -Pulm Toilet, flutter valve, xopenex nebs and Mucinex 1200 twice a day  -wheezing today, continue nebs Q6 and add Q2H PRN  3. Hyponatremia-likely euvolemic hyponatremia- -improved, stop IVF  4. Acute  Kidney injury/CKd 1-slowly improving -Stop IV fluids.  5. Prior h/o C diff-monitor, add floraster  6. Htn-Mod controlled only.  Ace d/c 4.7 and Metoprolol 12.5 bid  Stable  7. H/o Pre-tibial ulcer.   stable, wound care  8. Insomnia-Cut down Restoril 30 changed to 7.5mg  qhs   9. R hand pain-probably secondary to neuropathy-gabapentin 300 twice a day was ordered, was cut down due to drowsiness  10. H/o Prostate Ca-ON Lupron needs to be re-started as an out-patient.  Continue Flomax 0.4 mg daily  11. HLd-continue Zocor 40 qhs  Code Status: Partial Code Family Communication: none at bedside Disposition Plan: SNF tomorrow   Zannie Cove, MD  [facility] Pager [phone]    Consultants:  PCCM  Procedures:  CXR 4.5.14. No acute findings. 2. Low lung volumes.  CT Angio 4.5.14 1. Nonspecific areas ofground-glass attenuation and atelectasis are noted in both lungs. 2. No specific features to suggest acute pulmonary embolus. 3. Pulmonary nodule in the right upper lobe measures 6 mm. 4. Borderline enlarged right paratracheal lymph nodes. Attention on follow-up imaging is advised.  Chest x-ray  08/05/2012= slight worsening of left lung airspace opacity  Antibiotics:  Cefepime 4.5.-4/9  Levaquin  4.5-4/9  Vancomycin 4.5-4/9  Augmentin 4/9  Blood cultures pending   Subjective  Doing ok, breathing better, still some wheezing and SoB   Objective    Objective: Filed Vitals:   08/08/12 0236 08/08/12 0602 08/08/12 0825 08/08/12 0921  BP:  156/55    Pulse:  82    Temp:  98.6 F (37 C)    TempSrc:  Oral    Resp: 18 20  20   Height:      Weight:      SpO2:  97% 98% 91%    Intake/Output Summary (Last 24 hours) at 08/08/12 0931 Last data filed at 08/07/12 2139  Gross per 24 hour  Intake    320 ml  Output    905 ml  Net   -585 ml    Exam:  General: Alert.  Slightly HOH.  No apparent distress. Cardiovascular: s1 s2 ? M R side chest Respiratory:  chest clearer but still has bilateral wet crackles and conducted upper airway sounds Abdomen: soft NT/ND no rebound or gaurding Skin: Dressing to RLE removed 08/06/12 a.m. noted documentation but no significant ulcer or skin Neuro no focal deficit noted Right arm is tender to touch over the joints aren't  Data Reviewed: Basic Metabolic Panel:  Recent Labs Lab 08/04/12 0528 08/05/12 0450 08/06/12 0330 08/07/12 0449 08/08/12 0505  NA 127* 126* 127* 131* 132*  K 4.4 5.0 4.9 4.8 5.0  CL 97 96 96 99 100  CO2 23 22 24 25 23   GLUCOSE 120* 108* 101* 103* 110*  BUN 17 25* 33* 37* 34*  CREATININE 1.34 1.72* 1.81* 1.55* 1.34  CALCIUM 8.7 8.6 8.8 8.8 8.9   Liver Function Tests:  Recent Labs Lab 08/03/12 0938  AST 24  ALT 20  ALKPHOS 60  BILITOT 0.3  PROT 6.5  ALBUMIN 3.3*   No results found for this basename: LIPASE, AMYLASE,  in the last 168 hours No results found for this basename: AMMONIA,  in the last 168 hours CBC:  Recent Labs Lab 08/03/12 0938 08/04/12 0528 08/05/12 0450 08/06/12 0330 08/07/12 0449 08/08/12 0505  WBC 13.2* 8.9 14.5* 11.6* 9.0 7.1  NEUTROABS 11.2*  --   --   --   --   --   HGB  10.9* 10.3* 9.9* 9.9* 9.4* 9.9*  HCT 32.0* 30.4* 29.0* 29.3* 27.8* 29.2*  MCV 90.4 91.8 92.4 91.3 91.7 91.3  PLT 222 202 203 204 198 209   Cardiac Enzymes:  Recent Labs Lab 08/03/12 0938  TROPONINI <0.30   BNP: No components found with this basename: POCBNP,  CBG:  Recent Labs Lab 08/05/12 2051 08/06/12 2322  GLUCAP 108* 105*    Recent Results (from the past 240 hour(s))  CULTURE, BLOOD (ROUTINE X 2)     Status: None   Collection Time    08/03/12  9:25 AM      Result Value Range Status   Specimen Description BLOOD RIGHT HAND  5 ML IN [facility] BOTTLE   Final   Special Requests NONE   Final   Culture  Setup Time 08/04/2012 00:00   Final   Culture     Final   Value:        BLOOD CULTURE RECEIVED NO GROWTH TO DATE CULTURE WILL BE HELD FOR 5 DAYS BEFORE ISSUING A FINAL NEGATIVE REPORT   Report Status PENDING   Incomplete  CULTURE, BLOOD (ROUTINE X 2)     Status: None   Collection Time    08/03/12  9:42 AM      Result Value Range Status   Specimen Description BLOOD RIGHT ARM  4 ML IN [facility] BOTTLE   Final   Special Requests NONE   Final   Culture  Setup Time 08/03/2012 13:41   Final   Culture     Final   Value:        BLOOD CULTURE RECEIVED NO GROWTH TO DATE CULTURE WILL BE HELD FOR 5 DAYS BEFORE ISSUING A FINAL NEGATIVE REPORT   Report Status PENDING   Incomplete  CULTURE, EXPECTORATED SPUTUM-ASSESSMENT     Status: None   Collection Time    08/04/12  8:45 AM      Result Value Range Status   Specimen Description SPUTUM   Final   Special Requests NONE   Final   Sputum evaluation     Final   Value: THIS SPECIMEN IS ACCEPTABLE. RESPIRATORY CULTURE REPORT TO FOLLOW.   Report Status 08/04/2012 FINAL   Final  CULTURE, RESPIRATORY (NON-EXPECTORATED)     Status: None   Collection Time    08/04/12  8:45 AM      Result Value Range Status   Specimen Description SPUTUM   Final   Special Requests NONE   Final   Gram Stain     Final   Value: ABUNDANT WBC PRESENT, PREDOMINANTLY PMN      RARE SQUAMOUS EPITHELIAL CELLS PRESENT     FEW GRAM POSITIVE RODS     FEW GRAM NEGATIVE RODS     FEW GRAM POSITIVE COCCI  Culture NORMAL OROPHARYNGEAL FLORA   Final   Report Status 08/06/2012 FINAL   Final  MRSA PCR SCREENING     Status: None   Collection Time    08/05/12 12:22 PM      Result Value Range Status   MRSA by PCR NEGATIVE  NEGATIVE Final   Comment:            The GeneXpert MRSA Assay (FDA     approved for NASAL specimens     only), is one component of a     comprehensive MRSA colonization     surveillance program. It is not     intended to diagnose MRSA     infection nor to guide or     monitor treatment for     MRSA infections.  URINE CULTURE     Status: None   Collection Time    08/05/12  5:27 PM      Result Value Range Status   Specimen Description URINE, CLEAN CATCH   Final   Special Requests NONE   Final   Culture  Setup Time 08/05/2012 23:16   Final   Colony Count NO GROWTH   Final   Culture NO GROWTH   Final   Report Status 08/06/2012 FINAL   Final  CULTURE, BLOOD (ROUTINE X 2)     Status: None   Collection Time    08/05/12  5:48 PM      Result Value Range Status   Specimen Description BLOOD RIGHT HAND   Final   Special Requests BOTTLES DRAWN AEROBIC AND ANAEROBIC 10CC   Final   Culture  Setup Time 08/05/2012 22:46   Final   Culture     Final   Value:        BLOOD CULTURE RECEIVED NO GROWTH TO DATE CULTURE WILL BE HELD FOR 5 DAYS BEFORE ISSUING A FINAL NEGATIVE REPORT   Report Status PENDING   Incomplete  CULTURE, BLOOD (ROUTINE X 2)     Status: None   Collection Time    08/05/12  5:50 PM      Result Value Range Status   Specimen Description BLOOD RIGHT ARM   Final   Special Requests BOTTLES DRAWN AEROBIC AND ANAEROBIC 10CC   Final   Culture  Setup Time 08/05/2012 22:46   Final   Culture     Final   Value:        BLOOD CULTURE RECEIVED NO GROWTH TO DATE CULTURE WILL BE HELD FOR 5 DAYS BEFORE ISSUING A FINAL NEGATIVE REPORT   Report Status  PENDING   Incomplete     Studies:              All Imaging reviewed and is as per above notation   Scheduled Meds: . amoxicillin-clavulanate  500 mg Oral Q12H  . enoxaparin (LOVENOX) injection  30 mg Subcutaneous Q24H  . feeding supplement  237 mL Oral q morning - 10a  . gabapentin  100 mg Oral BID  . guaiFENesin  1,200 mg Oral BID  . levalbuterol  0.63 mg Nebulization Q6H  . metoprolol  2.5 mg Intravenous Q6H  . saccharomyces boulardii  250 mg Oral BID  . tamsulosin  0.4 mg Oral Daily  . temazepam  7.5 mg Oral QHS   Continuous Infusions:    08/08/2012, 9:31 AM    LOS: 5 days

## 2012-08-09 LAB — CULTURE, BLOOD (ROUTINE X 2): Culture: NO GROWTH

## 2012-08-09 MED ORDER — SACCHAROMYCES BOULARDII 250 MG PO CAPS: 250.0000 mg | ORAL_CAPSULE | Freq: Two times a day (BID) | ORAL | Status: DC

## 2012-08-09 MED ORDER — GUAIFENESIN ER 600 MG PO TB12: 1200.0000 mg | ORAL_TABLET | Freq: Two times a day (BID) | ORAL | Status: DC | PRN

## 2012-08-09 MED ORDER — TEMAZEPAM 7.5 MG PO CAPS: 7.5000 mg | ORAL_CAPSULE | Freq: Every evening | ORAL | Status: DC | PRN

## 2012-08-09 MED ORDER — ACETAMINOPHEN 325 MG PO TABS: 650.0000 mg | ORAL_TABLET | Freq: Four times a day (QID) | ORAL | Status: DC | PRN

## 2012-08-09 MED ORDER — AMOXICILLIN-POT CLAVULANATE 500-125 MG PO TABS: 500.0000 mg | ORAL_TABLET | Freq: Two times a day (BID) | ORAL | Status: DC

## 2012-08-09 MED ORDER — GABAPENTIN 100 MG PO CAPS: 100.0000 mg | ORAL_CAPSULE | Freq: Three times a day (TID) | ORAL | Status: DC

## 2012-08-09 NOTE — Discharge Summary (Signed)
Physician Discharge Summary  Gannon Heinzman Bisher OZH:[id number] DOB: Sep 30, 1912 DOA: 08/03/2012  PCP: Marlowe Aschoff, FNP  Admit date: 08/03/2012 Discharge date: 08/09/2012  Time spent: 45 minutes  Recommendations for Outpatient Follow-up:  1. 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 male wth h/o hypertension, copd, came in from the assisted living facility for fever, sob, cough and on arrival to ED, he underwent A cxr and CT angio showing ground glass attenuation concerning for pneumonia. His oxygen sats were upper 80's On room air. He is being admitted to medical service for management of health care associated pneumonia. He is an elderly gentleman with hearing loss and vision loss from macular degeneration and most of the history was obtained from his daughter at bedside   Hospital Course:  1. Acute respiratory failure secondary to pneumonia -improved slwoly, weaned off Oxygen  -Was seen by Pulmonary  critical care on admission but did not require BIPAP or mechanical ventilation   2. HCAP vs Aspiration PNA- -was initially started on broad spectrum empiric coverage with Cefepime/levaquin/vancomycin -then transitioned to PO Augmentin on 4/9, will continue this for 4 more days to complete a 10day course  -strep Pneumo neg, Legionella Ag neg. Blood cultures Negative -Pulm  Toilet, flutter valve, xopenex nebs and Mucinex 1200 twice a day  -weaned off oxygen -had a swallow evaluation and recommendations were for regular diet with thin liquids and ground meats  3. Hyponatremia-likely euvolemic hyponatremia- -improved, IVF stopped  4. Acute Kidney injury/CKd 1 -creatinine was 1.8 initially this improved to 1.3 at discharge -Stopped IV fluids, repeat Bmet in 1 week   5. Prior h/o C diff-monitor, added floraster  6. Htn-Mod controlled only. Lisinopril resumed at discharged since kidney function much better and Metoprolol 12.5 bid  7. H/o Pre-tibial ulcer. stable, wound care, daily dressing changes  8. Insomnia-Cut down Restoril 30 changed to 7.5mg  qhs due to lethargy in am  9. R hand pain-probably secondary to neuropathy-was on gabapentin 300 twice a day but this was also cut down due to drowsiness  10. H/o Prostate Ca-ON Lupron can be resumed as out-patient. Continue Flomax 0.4 mg daily  11. HLd-continue Zocor 40 qhs  Code Status: Partial Code, NO CPR or INTUBATION, But yes to antiarrhythmics and vasopressors   Discharge Exam: Filed Vitals:   08/09/12 0147 08/09/12 0355 08/09/12 0558 08/09/12 0829  BP:  162/57 162/71   Pulse:  63 78   Temp:  98 F (36.7 C) 98.1 F (36.7 C)   TempSrc:  Oral Oral   Resp:  32 20   Height:      Weight:      SpO2: 91% 91% 98% 100%    General: AAOx3 Cardiovascular:S1S2/RRR Respiratory: CTAB, diminished at bases, upper airway congestion  Discharge Instructions  Discharge Orders   Future Appointments Provider Department Dept Phone  09/02/2012 2:15 PM Rana Snare, NP  [facility] [phone]   Future Orders Complete By Expires     Diet - low sodium heart healthy  As directed     Increase activity slowly  As directed         Medication List    TAKE these medications       acetaminophen 325 MG tablet  Commonly known as:  TYLENOL  Take 2 tablets (650 mg total) by mouth  every 6 (six) hours as needed for pain or fever.     amoxicillin-clavulanate 500-125 MG per tablet  Commonly known as:  AUGMENTIN  Take 1 tablet (500 mg total) by mouth every 12 (twelve) hours. For 4 days     BAZA PROTECT EX  Apply 1 application topically 2 (two) times daily. Apply to buttocks     diphenhydramine-acetaminophen 25-500 MG Tabs  Commonly known as:  TYLENOL PM  Take 1 tablet by mouth at bedtime as needed.     feeding supplement Liqd  Take 237 mLs by mouth 3 (three) times daily with meals.     FOLBEE PO  Take 1 tablet by mouth daily.     furosemide 20 MG tablet  Commonly known as:  LASIX  Take 20 mg by mouth every other day.     gabapentin 100 MG capsule  Commonly known as:  NEURONTIN  Take 1 capsule (100 mg total) by mouth 3 (three) times daily.     guaiFENesin 600 MG 12 hr tablet  Commonly known as:  MUCINEX  Take 2 tablets (1,200 mg total) by mouth 2 (two) times daily as needed for congestion.     lisinopril 5 MG tablet  Commonly known as:  PRINIVIL,ZESTRIL  Take 5 mg by mouth daily.     LUPRON DEPOT 45 MG injection  Generic drug:  Leuprolide Acetate (6 Month)  Inject 45 mg into the muscle every 6 (six) months.     magnesium hydroxide 400 MG/5ML suspension  Commonly known as:  MILK OF MAGNESIA  Take 30 mLs by mouth daily as needed. constipation     Melatonin 3 MG Caps  Take 1 capsule (3 mg total) by mouth at bedtime.     metoprolol tartrate 25 MG tablet  Commonly known as:  LOPRESSOR  Take 0.5 tablets (12.5 mg total) by mouth 2 (two) times daily.     saccharomyces boulardii 250 MG capsule  Commonly known as:  FLORASTOR  Take 1 capsule (250 mg total) by mouth 2 (two) times daily. For 6 days     simvastatin 40 MG tablet  Commonly known as:  ZOCOR  Take 40 mg by mouth every evening.     tamsulosin 0.4 MG Caps  Commonly known as:  FLOMAX  Take 1 capsule (0.4 mg total) by mouth daily.     temazepam 7.5 MG capsule  Commonly known as:  RESTORIL   Take 1 capsule (7.5 mg total) by mouth at bedtime as needed for sleep.           Follow-up Information   Follow up with POLITE,LATRICE, FNP In 1 week.       The results of significant diagnostics from this hospitalization (including imaging, microbiology, ancillary and laboratory) are listed below for reference.    Significant Diagnostic Studies: Dg Chest 2 View  08/03/2012  *RADIOLOGY REPORT*  Clinical Data: Cough, fever.  CHEST - 2 VIEW  Comparison: 04/04/2012  Findings: Mild cardiac enlargement.  No pleural effusion  or edema. Lung volumes are low.  No airspace consolidation.  IMPRESSION:  1.  No acute findings. 2.  Low lung volumes.   Original Report Authenticated By: Signa Kell, M.D.    Ct Angio Chest Pe W/cm &/or Wo Cm  08/03/2012  *RADIOLOGY REPORT*  Clinical Data: Hypoxia  CT ANGIOGRAPHY CHEST  Technique:  Multidetector CT imaging of the chest using the standard protocol during bolus administration of intravenous contrast. Multiplanar reconstructed images including MIPs were obtained and reviewed to evaluate the vascular anatomy.  Contrast: OMNIPAQUE IOHEXOL 350 MG/ML SOLN  Comparison: 08/02/2010  Findings: There is no pleural effusion identified.  Mild pleural thickening is noted within the posterior lung bases bilaterally. There is a small amount of fluid tracking along the major fissure of the right lung and oblique fissure in the left lung.  There are patchy areas of atelectasis noted bilaterally.  Most notable in the left lower lobe.  Focal area of ground-glass attenuation is noted in the right upper lobe, image number 50/series 9.  Pulmonary nodule in the right upper lobe measures 6 mm, image 52/series 9.  This appears new from the previous exam. Biapical scar like densities noted.  The heart size appears normal.  There is a right paratracheal lymph node which measures 1.3 cm, image 35/series 6.  There is an additional right paratracheal lymph node which measures 1.1 cm,  image 26/series 6.  Exam detail is slightly diminished due to respiratory motion artifact.  There is no saddle embolus identified within the main pulmonary artery.  There are no specific features identified to suggest a clinically relevant lobar or segmental pulmonary artery embolus.  Limited imaging through the upper abdomen is unremarkable.  Review of the visualized osseous structures is unremarkable.  No worrisome lytic or sclerotic bone lesions.  IMPRESSION:  1. Nonspecific areas of ground-glass attenuation and atelectasis are noted in both lungs.  2.  No specific features to suggest acute pulmonary embolus. 3.  Pulmonary nodule in the right upper lobe measures 6 mm. If the patient is at high risk for bronchogenic carcinoma, follow-up chest CT at 6-12 months is recommended.  If the patient is at low risk for bronchogenic carcinoma, follow-up chest CT at 12 months is recommended.  This recommendation follows the consensus statement: Guidelines for Management of Small Pulmonary Nodules Detected on CT Scans: A Statement from the Fleischner Society as published in Radiology 2005; 237:395-400.  4. Borderline enlarged right paratracheal lymph nodes.  Attention on follow-up imaging is advised.   Original Report Authenticated By: Signa Kell, M.D.    Dg Chest Port 1 View  08/05/2012  *RADIOLOGY REPORT*  Clinical Data: Hypoxia.  Evaluate for worsening pneumonia.  PORTABLE CHEST - 1 VIEW  Comparison: Radiographs and CT 08/03/2012.  Findings: 0700 hours.  There has been interval slight worsening of the left lung air space opacities and associated volume loss. Patchy right basilar opacities are unchanged.  The heart size and mediastinal contours are stable.  There is no significant pleural effusion or pneumothorax.  IMPRESSION: Slight worsening of left lung air space opacities.   Original Report Authenticated By: Carey Bullocks, M.D.     Microbiology: Recent Results (from the past 240 hour(s))  CULTURE, BLOOD  (ROUTINE X 2)     Status: None   Collection Time    08/03/12  9:25 AM      Result Value Range Status   Specimen Description BLOOD RIGHT HAND  5 ML IN [facility] BOTTLE   Final  Special Requests NONE   Final   Culture  Setup Time 08/04/2012 00:00   Final   Culture NO GROWTH 5 DAYS   Final   Report Status 08/09/2012 FINAL   Final  CULTURE, BLOOD (ROUTINE X 2)     Status: None   Collection Time    08/03/12  9:42 AM      Result Value Range Status   Specimen Description BLOOD RIGHT ARM  4 ML IN [facility] BOTTLE   Final   Special Requests NONE   Final   Culture  Setup Time 08/03/2012 13:41   Final   Culture NO GROWTH 5 DAYS   Final   Report Status 08/09/2012 FINAL   Final  CULTURE, EXPECTORATED SPUTUM-ASSESSMENT     Status: None   Collection Time    08/04/12  8:45 AM      Result Value Range Status   Specimen Description SPUTUM   Final   Special Requests NONE   Final   Sputum evaluation     Final   Value: THIS SPECIMEN IS ACCEPTABLE. RESPIRATORY CULTURE REPORT TO FOLLOW.   Report Status 08/04/2012 FINAL   Final  CULTURE, RESPIRATORY (NON-EXPECTORATED)     Status: None   Collection Time    08/04/12  8:45 AM      Result Value Range Status   Specimen Description SPUTUM   Final   Special Requests NONE   Final   Gram Stain     Final   Value: ABUNDANT WBC PRESENT, PREDOMINANTLY PMN     RARE SQUAMOUS EPITHELIAL CELLS PRESENT     FEW GRAM POSITIVE RODS     FEW GRAM NEGATIVE RODS     FEW GRAM POSITIVE COCCI   Culture NORMAL OROPHARYNGEAL FLORA   Final   Report Status 08/06/2012 FINAL   Final  MRSA PCR SCREENING     Status: None   Collection Time    08/05/12 12:22 PM      Result Value Range Status   MRSA by PCR NEGATIVE  NEGATIVE Final   Comment:            The GeneXpert MRSA Assay (FDA     approved for NASAL specimens     only), is one component of a     comprehensive MRSA colonization     surveillance program. It is not     intended to diagnose MRSA     infection nor to guide or      monitor treatment for     MRSA infections.  URINE CULTURE     Status: None   Collection Time    08/05/12  5:27 PM      Result Value Range Status   Specimen Description URINE, CLEAN CATCH   Final   Special Requests NONE   Final   Culture  Setup Time 08/05/2012 23:16   Final   Colony Count NO GROWTH   Final   Culture NO GROWTH   Final   Report Status 08/06/2012 FINAL   Final  CULTURE, BLOOD (ROUTINE X 2)     Status: None   Collection Time    08/05/12  5:48 PM      Result Value Range Status   Specimen Description BLOOD RIGHT HAND   Final   Special Requests BOTTLES DRAWN AEROBIC AND ANAEROBIC 10CC   Final   Culture  Setup Time 08/05/2012 22:46   Final   Culture     Final   Value:  BLOOD CULTURE RECEIVED NO GROWTH TO DATE CULTURE WILL BE HELD FOR 5 DAYS BEFORE ISSUING A FINAL NEGATIVE REPORT   Report Status PENDING   Incomplete  CULTURE, BLOOD (ROUTINE X 2)     Status: None   Collection Time    08/05/12  5:50 PM      Result Value Range Status   Specimen Description BLOOD RIGHT ARM   Final   Special Requests BOTTLES DRAWN AEROBIC AND ANAEROBIC 10CC   Final   Culture  Setup Time 08/05/2012 22:46   Final   Culture     Final   Value:        BLOOD CULTURE RECEIVED NO GROWTH TO DATE CULTURE WILL BE HELD FOR 5 DAYS BEFORE ISSUING A FINAL NEGATIVE REPORT   Report Status PENDING   Incomplete     Labs: Basic Metabolic Panel:  Recent Labs Lab 08/04/12 0528 08/05/12 0450 08/06/12 0330 08/07/12 0449 08/08/12 0505  NA 127* 126* 127* 131* 132*  K 4.4 5.0 4.9 4.8 5.0  CL 97 96 96 99 100  CO2 23 22 24 25 23   GLUCOSE 120* 108* 101* 103* 110*  BUN 17 25* 33* 37* 34*  CREATININE 1.34 1.72* 1.81* 1.55* 1.34  CALCIUM 8.7 8.6 8.8 8.8 8.9   Liver Function Tests:  Recent Labs Lab 08/03/12 0938  AST 24  ALT 20  ALKPHOS 60  BILITOT 0.3  PROT 6.5  ALBUMIN 3.3*   No results found for this basename: LIPASE, AMYLASE,  in the last 168 hours No results found for this basename:  AMMONIA,  in the last 168 hours CBC:  Recent Labs Lab 08/03/12 0938 08/04/12 0528 08/05/12 0450 08/06/12 0330 08/07/12 0449 08/08/12 0505  WBC 13.2* 8.9 14.5* 11.6* 9.0 7.1  NEUTROABS 11.2*  --   --   --   --   --   HGB 10.9* 10.3* 9.9* 9.9* 9.4* 9.9*  HCT 32.0* 30.4* 29.0* 29.3* 27.8* 29.2*  MCV 90.4 91.8 92.4 91.3 91.7 91.3  PLT 222 202 203 204 198 209   Cardiac Enzymes:  Recent Labs Lab 08/03/12 0938  TROPONINI <0.30   BNP: BNP (last 3 results) No results found for this basename: PROBNP,  in the last 8760 hours CBG:  Recent Labs Lab 08/05/12 2051 08/06/12 2322 08/09/12 0407  GLUCAP 108* 105* 109*       Signed:  Alila Sotero  [facility] 08/09/2012, 10:01 AM

## 2012-08-09 NOTE — Progress Notes (Signed)
Physical Therapy Treatment Patient Details Name: Nyquan Selbe Hainer MRN: [id number] DOB: May 28, 1912 Today's Date: 08/09/2012 Time: 2952-8413 PT Time Calculation (min): 38 min  PT Assessment / Plan / Recommendation Comments on Treatment Session  Assisted pt OOB to amb twice. Pt progressing slowly and still requires + 2 assist for mobility.      Follow Up Recommendations  Home health PT;SNF;Supervision/Assistance - 24 hour     Does the patient have the potential to tolerate intense rehabilitation     Barriers to Discharge        Equipment Recommendations  Rolling walker with 5" wheels    Recommendations for Other Services    Frequency Min 3X/week   Plan Discharge plan remains appropriate;Discharge plan needs to be updated    Precautions / Restrictions Precautions Precautions: Fall Restrictions Weight Bearing Restrictions: No   Pertinent Vitals/Pain C/o weakness    Mobility  Bed Mobility Bed Mobility: Supine to Sit;Sitting - Scoot to Edge of Bed Supine to Sit: 1: +2 Total assist Supine to Sit: Patient Percentage: 20% Sitting - Scoot to Edge of Bed: 1: +2 Total assist Sitting - Scoot to Edge of Bed: Patient Percentage: 10% Details for Bed Mobility Assistance: used pad to assist and HOB elevated 45 Pt sat EOB x 4 min at supervision level. Transfers Transfers: Sit to Stand Sit to Stand: 1: +2 Total assist;From bed;From elevated surface;From chair/3-in-1 Sit to Stand: Patient Percentage: 40% Stand to Sit: 1: +2 Total assist;To chair/3-in-1 Details for Transfer Assistance: Pt unable to push self up and required increased assist to rise. Partial stance of flex hips and knees. Ambulation/Gait Ambulation/Gait Assistance: 1: +2 Total assist Ambulation/Gait: Patient Percentage: 60% Ambulation Distance (Feet): 40 Feet (20' x 2 sitting rest break) Assistive device: Rolling walker Ambulation/Gait Assistance Details: #rd assist following with chair.  Pt amb 40" (20'x2) on RA 96%.   Poor flex hiups and knees posture with decreased stance time on R.  50% VC's for upright posture and proper walker to self distance.  Gait Pattern: Step-to pattern;Decreased step length - right;Decreased step length - left;Trunk flexed;Decreased stance time - right Gait velocity: decreased General Gait Details: pt with very limited endurance.     PT Goals                                                   Progressing slowly    Visit Information  Last PT Received On: 08/09/12 Assistance Needed: +2    Subjective Data      Cognition    impaired Follows directions/confused to current environment   Balance   poor  End of Session PT - End of Session Equipment Utilized During Treatment: Gait belt Activity Tolerance: Patient limited by fatigue Patient left: in chair;with call bell/phone within reach;with family/visitor present Nurse Communication: Need for lift equipment  Felecia Shelling  PTA [facility]  Acute  Rehab Pager      [phone]

## 2012-08-09 NOTE — Progress Notes (Signed)
Attempted to call report to [facility]. Receiving RN concerned pt does not meet requirements for ALF. RN stated she would contact SW. I called SW to inform [facility] would not be receiving pt.

## 2012-08-09 NOTE — Progress Notes (Signed)
Clinical Social Work Department CLINICAL SOCIAL WORK PLACEMENT NOTE 08/09/2012  Patient:  [name], Antonio Bradley  Account Number:  [id number] Admit date:  08/03/2012  Clinical Social Worker:  Unk Lightning, LCSW  Date/time:  08/09/2012 03:00 PM  Clinical Social Work is seeking post-discharge placement for this patient at the following level of care:   SKILLED NURSING   (*CSW will update this form in Epic as items are completed)   08/09/2012  Patient/family provided with [facility] Department of Clinical Social Work's list of facilities offering this level of care within the geographic area requested by the patient (or if unable, by the patient's family).  08/09/2012  Patient/family informed of their freedom to choose among providers that offer the needed level of care, that participate in Medicare, Medicaid or managed care program needed by the patient, have an available bed and are willing to accept the patient.  08/09/2012  Patient/family informed of [facility]' ownership interest in [facility], as well as of the fact that they are under no obligation to receive care at this facility.  PASARR submitted to EDS on existing # PASARR number received from EDS on   FL2 transmitted to all facilities in geographic area requested by pt/family on  08/09/2012 FL2 transmitted to all facilities within larger geographic area on   Patient informed that his/her managed care company has contracts with or will negotiate with  certain facilities, including the following:     Patient/family informed of bed offers received:  08/09/2012 Patient chooses bed at [facility] Physician recommends and patient chooses bed at    Patient to be transferred to [facility] on  08/09/2012 Patient to be transferred to facility by [facility]  The following physician request were entered in Epic:   Additional Comments:

## 2012-08-09 NOTE — Progress Notes (Signed)
Speech Language Pathology Dysphagia Treatment Patient Details Name: Antonio Bradley MRN: [id number] DOB: Feb 24, 1913 Today's Date: 08/09/2012 Time: 7846-9629 SLP Time Calculation (min): 17 min  Assessment / Plan / Recommendation Clinical Impression  Skilled dysphagia tx included determining tolerance of po diet, benefit of compensatory strategies and pt education.  Pt sitting upright in chair, RN administering medicine with applesauce - whole.- followed by Ensure.  Delayed swallow noted with delayed cough after intake of all pills (x7) - productive with pt ability to clear using Yankeur's suction.  Pt likely having ongoing aspiration but fortunately can cough and expectorate, increasing airway protection.  Pt stated getting to suction was difficult for him therefore SLP placed it in his hand after getting new line and Yankeur's.  Rec pt have oral suction for use after d'c for maximal airway protection.  Pt is afebrile, intake 25-50%, and has congested productive cough.  Rec continue diet with strict aspiration precautions.  Again, as eating is laborious for pt and aspiration risk is high and ongoing,  rec consider palliative care consult to establish goals of care.    No family present to educate to precautions, used teach back with pt for compehension and left written tips for family.       Diet Recommendation  Initiate / Change Diet: Regular;Thin liquid (ground meats, room temp drinks)    SLP Plan Continue with current plan of care   Pertinent Vitals/Pain Afebrile, productive cough   Swallowing Goals  SLP Swallowing Goals Swallow Study Goal #2 - Progress: Progressing toward goal Swallow Study Goal #3 - Progress: Progressing toward goal  General Behavior/Cognition: Decreased sustained attention;Hard of hearing;Confused;Alert;Requires cueing Oral Cavity - Dentition: Adequate natural dentition Patient Positioning: Upright in chair  Oral Cavity - Oral Hygiene   oral cavity clean  Dysphagia  Treatment Treatment focused on: Skilled observation of diet tolerance;Patient/family/caregiver education Family/Caregiver Educated: pt, no family present Treatment Methods/Modalities: Skilled observation Patient observed directly with PO's: Yes Type of PO's observed: Thin liquids;Nectar-thick liquids;Dysphagia 1 (puree) (medicine given whole with applesauce ) Feeding: Total assist Liquids provided via: Cup;Straw Oral Phase Signs & Symptoms: Prolonged bolus formation;Prolonged oral phase Pharyngeal Phase Signs & Symptoms: Suspected delayed swallow initiation;Delayed cough Type of cueing: Verbal;Tactile Amount of cueing: Total   GO    Antonio Burnet, MS Antonio Bradley SLP [phone]

## 2012-08-09 NOTE — Progress Notes (Signed)
Clinical Social Work  Patient unable to return to ALF. CSW spoke with dtr who chose [facility]. CSW faxed dc summary and prepared DC packet. Patient, family and RN aware of dc plans and agreeable. CSW coordinated transportation via [place]. CSW is signing off.  Unk Lightning, LCSW  (Coverage for [employer])

## 2012-08-09 NOTE — Progress Notes (Signed)
Patient is set to discharge back to [facility] today. Patient's daughter, Karolee Stamps (ph#: [phone]) aware. CSW confirmed with [facility] @ ALF that patient is ok to return & she reviewed d/c summary & FL2 with medications. PTAR scheduled for 1:00 pickup. Discharge packet left in [facility].   Unice Bailey, LCSW [place] Clinical Social Worker cell #: [phone]

## 2012-08-09 NOTE — Progress Notes (Signed)
Pt woke up stating "I'm confused and feel funny."  He responded to orientation questions correctly but kept asking for family members despite reorientation to place and time.  VS were T 98 BP 162/57 P 63 R 32 and O2 on 91% room air.  Then the pt c/o eyes feeling funny and then his mouth.  We performed a neuro check which was negative.  We gave a prn nebulizer treatment as ordered and rinsed out his mouth.  Pt calmed down and went back to sleep.  Will continue to monitor pt. Daphene Calamity [place] 6:48 AM 08/09/2012

## 2012-08-09 NOTE — Progress Notes (Signed)
Report called to Janette at [facility]. Pt transported via [facility].

## 2012-08-11 LAB — CULTURE, BLOOD (ROUTINE X 2): Culture: NO GROWTH

## 2012-08-12 ENCOUNTER — Non-Acute Institutional Stay (SKILLED_NURSING_FACILITY): Payer: Medicare Other | Admitting: Internal Medicine

## 2012-08-12 DIAGNOSIS — J9600 Acute respiratory failure, unspecified whether with hypoxia or hypercapnia: Secondary | ICD-10-CM

## 2012-08-12 DIAGNOSIS — J189 Pneumonia, unspecified organism: Secondary | ICD-10-CM

## 2012-08-12 DIAGNOSIS — R630 Anorexia: Secondary | ICD-10-CM

## 2012-08-12 DIAGNOSIS — R531 Weakness: Secondary | ICD-10-CM

## 2012-08-12 DIAGNOSIS — R5381 Other malaise: Secondary | ICD-10-CM

## 2012-08-12 DIAGNOSIS — G4700 Insomnia, unspecified: Secondary | ICD-10-CM

## 2012-08-12 DIAGNOSIS — I10 Essential (primary) hypertension: Secondary | ICD-10-CM

## 2012-08-12 DIAGNOSIS — N179 Acute kidney failure, unspecified: Secondary | ICD-10-CM

## 2012-08-12 NOTE — Progress Notes (Signed)
Patient ID: Antonio Bradley, male   DOB: [birth year], 79 y.o.   MRN: [id number]   PCP: Marlowe Aschoff, FNP  Code Status: full code  No Known Allergies  Chief Complaint: new admit post hospitalization 08/03/12- 08/09/12  HPI:  History of present illness:   79 y.o. male patient who resided in [facility] assisted living was admitted to hospital on above date with fever, cough and SOB. He has known history of hypertension and copd.his chest xray  and CT angio showing ground glass attenuation concerning for pneumonia. He was admitted for management of health care associated pneumonia. He was seen by Pulmonary  critical care on admission but did not require BIPAP or mechanical ventilation. He tolerated o2 by nasal canula well and was slowly weaned off it. He was initially started on broad spectrum empiric coverage with Cefepime/levaquin/vancomycin and then transitioned to PO Augmentin on 4/9 and on it until 08/13/12. Tests were negative for strep Pneumo, Legionella Ag and Blood cultures. He had a swallow evaluation and recommendations were for regular diet with thin liquids and ground meats He was seen in his room today. He is an elderly gentleman with hearing loss and vision loss from macular degeneration but is alert and oriented. He is in no acute distress.He does not have an appetite. He feels weak overall. Has not been using his flutter valve at present. He is breathing comfortably on room air.  Review of Systems: Review of Systems  Constitutional: Negative for fever, chills and malaise/fatigue.  HENT: Positive for hearing loss. Negative for ear pain, congestion and sore throat.   Eyes: Negative.   Respiratory: Positive for cough, sputum production and shortness of breath.   Cardiovascular: Negative for chest pain, palpitations and leg swelling.  Gastrointestinal: Negative for heartburn, nausea, vomiting and abdominal pain.  Genitourinary: Negative for dysuria.  Musculoskeletal: Negative for  falls.  Neurological: Positive for weakness. Negative for dizziness and headaches.  Psychiatric/Behavioral: Negative for depression and memory loss.    Past Medical History  Diagnosis Date  . ANEMIA, B12 DEFICIENCY 12/06/2006  . INSOMNIA, CHRONIC 05/22/2007  . PERIPHERAL AUTONOMIC NEUROPATHY D/O CLASS ELSW 10/27/2008  . HYPERTENSION 11/26/2006  . COPD 12/06/2006  . Slow transit constipation 05/22/2007  . OSTEOARTHROSIS, LOCAL NOS, LOWER LEG 12/12/2006  . Osteoarth NOS-Unspec 12/06/2006  . EDEMA LEG 09/28/2009  . MASS, LUNG 10/07/2009  . NON-HODGKIN'S LYMPHOMA 09/04/2007  . Prostate ca   . Non hodgkins lymphoma   . Pneumonia    Past Surgical History  Procedure Laterality Date  . Hemorrhoid surgery    .  skin cancers    . Cataract extraction, bilateral     Social History:   reports that he quit smoking about 62 years ago. He has never used smokeless tobacco. He reports that he does not drink alcohol or use illicit drugs.  Family History  Problem Relation Age of Onset  . Pneumonia Mother   . Cancer Father   . Breast cancer Daughter   . Testicular cancer Son     Medications: Patient's Medications  New Prescriptions   No medications on file  Previous Medications   ACETAMINOPHEN (TYLENOL) 325 MG TABLET    Take 2 tablets (650 mg total) by mouth every 6 (six) hours as needed for pain or fever.   AMOXICILLIN-CLAVULANATE (AUGMENTIN) 500-125 MG PER TABLET    Take 1 tablet (500 mg total) by mouth every 12 (twelve) hours. For 4 days   DIPHENHYDRAMINE-ACETAMINOPHEN (TYLENOL PM) 25-500 MG TABS    Take  1 tablet by mouth at bedtime as needed.   FEEDING SUPPLEMENT (ENSURE IMMUNE HEALTH) LIQD    Take 237 mLs by mouth 3 (three) times daily with meals.   FOLIC ACID-VIT B6-VIT B12 (FOLBEE PO)    Take 1 tablet by mouth daily.    FUROSEMIDE (LASIX) 20 MG TABLET    Take 20 mg by mouth every other day.    GABAPENTIN (NEURONTIN) 100 MG CAPSULE    Take 1 capsule (100 mg total) by mouth 3 (three) times daily.    GUAIFENESIN (MUCINEX) 600 MG 12 HR TABLET    Take 2 tablets (1,200 mg total) by mouth 2 (two) times daily as needed for congestion.   LEUPROLIDE ACETATE, 6 MONTH, (LUPRON DEPOT) 45 MG INJECTION    Inject 45 mg into the muscle every 6 (six) months.   LISINOPRIL (PRINIVIL,ZESTRIL) 5 MG TABLET    Take 5 mg by mouth daily.   MAGNESIUM HYDROXIDE (MILK OF MAGNESIA) 400 MG/5ML SUSPENSION    Take 30 mLs by mouth daily as needed. constipation   MELATONIN 3 MG CAPS    Take 1 capsule (3 mg total) by mouth at bedtime.   METOPROLOL TARTRATE (LOPRESSOR) 25 MG TABLET    Take 0.5 tablets (12.5 mg total) by mouth 2 (two) times daily.   SACCHAROMYCES BOULARDII (FLORASTOR) 250 MG CAPSULE    Take 1 capsule (250 mg total) by mouth 2 (two) times daily. For 6 days   SIMVASTATIN (ZOCOR) 40 MG TABLET    Take 40 mg by mouth every evening.   SKIN PROTECTANTS, MISC. (BAZA PROTECT EX)    Apply 1 application topically 2 (two) times daily. Apply to buttocks   TAMSULOSIN (FLOMAX) 0.4 MG CAPS    Take 1 capsule (0.4 mg total) by mouth daily.   TEMAZEPAM (RESTORIL) 7.5 MG CAPSULE    Take 1 capsule (7.5 mg total) by mouth at bedtime as needed for sleep.  Modified Medications   No medications on file  Discontinued Medications   No medications on file    Physical Exam: Physical Exam  Constitutional: He is oriented to person, place, and time.  Elderly male patient in no acute distress.   HENT:  Head: Normocephalic and atraumatic.  Mouth/Throat: Oropharynx is clear and moist.  Eyes: Pupils are equal, round, and reactive to light.  Neck: Normal range of motion. Neck supple. No JVD present.  Cardiovascular: Normal rate and regular rhythm.   Pulmonary/Chest: Effort normal. No respiratory distress.  Rhonchi present bilaterally. Cough wit productive phlegm present  Abdominal: Soft. Bowel sounds are normal.  Musculoskeletal: Normal range of motion. He exhibits no edema and no tenderness.  Generalized weakness  Lymphadenopathy:     He has no cervical adenopathy.  Neurological: He is alert and oriented to person, place, and time. No cranial nerve deficit.  Skin: Skin is warm and dry. He is not diaphoretic.  Psychiatric: He has a normal mood and affect. His behavior is normal.   Filed Vitals:   08/12/12 1212  BP: 122/72  Pulse: 74  Temp: 98.3 F (36.8 C)  Resp: 18  SpO2: 95%      Labs reviewed: Basic Metabolic Panel:  Recent Labs  96/04/54 0330 08/07/12 0449 08/08/12 0505  NA 127* 131* 132*  K 4.9 4.8 5.0  CL 96 99 100  CO2 24 25 23   GLUCOSE 101* 103* 110*  BUN 33* 37* 34*  CREATININE 1.81* 1.55* 1.34  CALCIUM 8.8 8.8 8.9   Liver Function Tests:  Recent  Labs  04/04/12 0820 08/03/12 0938  AST 31 24  ALT 24 20  ALKPHOS 67 60  BILITOT 0.3 0.3  PROT 7.0 6.5  ALBUMIN 3.6 3.3*   CBC:  Recent Labs  04/04/12 0820 08/03/12 0938  08/06/12 0330 08/07/12 0449 08/08/12 0505  WBC 12.0* 13.2*  < > 11.6* 9.0 7.1  NEUTROABS 9.6* 11.2*  --   --   --   --   HGB 12.0* 10.9*  < > 9.9* 9.4* 9.9*  HCT 34.0* 32.0*  < > 29.3* 27.8* 29.2*  MCV 89.2 90.4  < > 91.3 91.7 91.3  PLT 210 222  < > 204 198 209  < > = values in this interval not displayed. Cardiac Enzymes:  Recent Labs  08/03/12 0938  TROPONINI <0.30   CBG:  Recent Labs  08/05/12 2051 08/06/12 2322 08/09/12 0407  GLUCAP 108* 105* 109*    Radiological Exams: Dg Chest 2 View  08/03/2012  *RADIOLOGY REPORT*  Clinical Data: Cough, fever.  CHEST - 2 VIEW  Comparison: 04/04/2012  Findings: Mild cardiac enlargement.  No pleural effusion or edema. Lung volumes are low.  No airspace consolidation.  IMPRESSION:  1.  No acute findings. 2.  Low lung volumes.   Original Report Authenticated By: Signa Kell, M.D.    Ct Angio Chest Pe W/cm &/or Wo Cm  08/03/2012  *RADIOLOGY REPORT*  Clinical Data: Hypoxia  CT ANGIOGRAPHY CHEST Technique:  Multidetector CT imaging of the chest using the standard protocol during bolus administration of  intravenous contrast. Multiplanar reconstructed images including MIPs were obtained and reviewed to evaluate the vascular anatomy.  Contrast: OMNIPAQUE IOHEXOL 350 MG/ML SOLN  Comparison: 08/02/2010  Findings: There is no pleural effusion identified.  Mild pleural thickening is noted within the posterior lung bases bilaterally. There is a small amount of fluid tracking along the major fissure of the right lung and oblique fissure in the left lung.  There are patchy areas of atelectasis noted bilaterally.  Most notable in the left lower lobe.  Focal area of ground-glass attenuation is noted in the right upper lobe, image number 50/series 9.  Pulmonary nodule in the right upper lobe measures 6 mm, image 52/series 9.  This appears new from the previous exam. Biapical scar like densities noted.  The heart size appears normal.  There is a right paratracheal lymph node which measures 1.3 cm, image 35/series 6.  There is an additional right paratracheal lymph node which measures 1.1 cm, image 26/series 6.  Exam detail is slightly diminished due to respiratory motion artifact.  There is no saddle embolus identified within the main pulmonary artery.  There are no specific features identified to suggest a clinically relevant lobar or segmental pulmonary artery embolus.  Limited imaging through the upper abdomen is unremarkable.  Review of the visualized osseous structures is unremarkable.  No worrisome lytic or sclerotic bone lesions.  IMPRESSION:  1. Nonspecific areas of ground-glass attenuation and atelectasis are noted in both lungs.  2.  No specific features to suggest acute pulmonary embolus. 3.  Pulmonary nodule in the right upper lobe measures 6 mm. If the patient is at high risk for bronchogenic carcinoma, follow-up chest CT at 6-12 months is recommended.  If the patient is at low risk for bronchogenic carcinoma, follow-up chest CT at 12 months is recommended.  This recommendation follows the consensus  statement: Guidelines for Management of Small Pulmonary Nodules Detected on CT Scans: A Statement from the Fleischner Society as published  in Radiology 2005; 237:395-400.  4. Borderline enlarged right paratracheal lymph nodes.  Attention on follow-up imaging is advised.   Original Report Authenticated By: Signa Kell, M.D.    Dg Chest Port 1 View  08/05/2012  *RADIOLOGY REPORT*  Clinical Data: Hypoxia.  Evaluate for worsening pneumonia.  PORTABLE CHEST - 1 VIEW  Comparison: Radiographs and CT 08/03/2012.  Findings: 0700 hours.  There has been interval slight worsening of the left lung air space opacities and associated volume loss. Patchy right basilar opacities are unchanged.  The heart size and mediastinal contours are stable.  There is no significant pleural effusion or pneumothorax.  IMPRESSION: Slight worsening of left lung air space opacities.   Original Report Authenticated By: Carey Bullocks, M.D.    Assessment/Plan  Acute respiratory failure secondary to pneumonia- improved with oxygen by nasal canula and antibiotics, to complete course of augmentin tomorrow. With rhonchi on exam and mucus, continue mucinex and encouraged pt to use flutter valve to help open his airways. Pt agrees. Will have CNA check on patient and encourage periodically. Will start duonebs every 6h for now for a week and then prn  HCAP- to complete course of antibiotics, pulmonary toileting and use of flutter valve encouraged. Continue mucinex. To take aspiration precautions. Continue florastor for now.  Generalized weakness- from recent infection. Will have him work with therapy team for gait training and muscle strengthening. Will need fall and aspiration precautions. With poor po intake and absence of appetite, will have him on remeron to help stimulate appetite and will continue nutritional supplements  Acute Kidney injury- in setting of infection and dehydration. Also had hyponatremia in this setting. Check  bmp.encouraged fluid intake  Hypertension- under control at present with lisinopril and metoprolol. Continue current regimen. Monitor bp readings.  Pre-tibial ulcer- stable, wound care, daily dressing change  Insomnia-Continue restoril for now and monitor  h/o Prostate Ca- continue lupron and tamsulosin, monitor  Goals of care: work with rehab team and return to assisted living facility   Labs/tests ordered- cbc, cmp

## 2012-08-26 ENCOUNTER — Telehealth: Payer: Self-pay | Admitting: Oncology

## 2012-08-26 NOTE — Telephone Encounter (Signed)
pt daughter called to advise that he was sick and did not want to r/s until he was not sick and had more strength

## 2012-09-02 ENCOUNTER — Ambulatory Visit: Payer: Medicare Other | Admitting: Nurse Practitioner

## 2012-09-05 ENCOUNTER — Encounter: Payer: Self-pay | Admitting: Nurse Practitioner

## 2012-09-05 NOTE — Progress Notes (Signed)
  Subjective:    Patient ID: Antonio Bradley, male    DOB: November 07, 1912, 79 y.o.   MRN: [id number]  HPI    Review of Systems     Objective:   Physical Exam        Assessment & Plan:   This encounter was created in error - please disregard.

## 2012-10-04 ENCOUNTER — Other Ambulatory Visit: Payer: Self-pay | Admitting: Internal Medicine

## 2012-11-01 ENCOUNTER — Emergency Department (HOSPITAL_COMMUNITY): Payer: Medicare Other

## 2012-11-01 ENCOUNTER — Emergency Department (HOSPITAL_COMMUNITY)
Admission: EM | Admit: 2012-11-01 | Discharge: 2012-11-01 | Disposition: A | Discharge status: Home / Self Care | Payer: Medicare Other | Attending: Emergency Medicine | Admitting: Emergency Medicine

## 2012-11-01 ENCOUNTER — Encounter (HOSPITAL_COMMUNITY): Payer: Self-pay | Admitting: Emergency Medicine

## 2012-11-01 DIAGNOSIS — Y9389 Activity, other specified: Secondary | ICD-10-CM | POA: Insufficient documentation

## 2012-11-01 DIAGNOSIS — Z8546 Personal history of malignant neoplasm of prostate: Secondary | ICD-10-CM | POA: Insufficient documentation

## 2012-11-01 DIAGNOSIS — J4489 Other specified chronic obstructive pulmonary disease: Secondary | ICD-10-CM | POA: Insufficient documentation

## 2012-11-01 DIAGNOSIS — H9190 Unspecified hearing loss, unspecified ear: Secondary | ICD-10-CM | POA: Insufficient documentation

## 2012-11-01 DIAGNOSIS — S0990XA Unspecified injury of head, initial encounter: Secondary | ICD-10-CM | POA: Insufficient documentation

## 2012-11-01 DIAGNOSIS — W19XXXA Unspecified fall, initial encounter: Secondary | ICD-10-CM

## 2012-11-01 DIAGNOSIS — S4980XA Other specified injuries of shoulder and upper arm, unspecified arm, initial encounter: Secondary | ICD-10-CM | POA: Insufficient documentation

## 2012-11-01 DIAGNOSIS — Z8669 Personal history of other diseases of the nervous system and sense organs: Secondary | ICD-10-CM | POA: Insufficient documentation

## 2012-11-01 DIAGNOSIS — I10 Essential (primary) hypertension: Secondary | ICD-10-CM | POA: Insufficient documentation

## 2012-11-01 DIAGNOSIS — Y9210 Unspecified residential institution as the place of occurrence of the external cause: Secondary | ICD-10-CM | POA: Insufficient documentation

## 2012-11-01 DIAGNOSIS — IMO0002 Reserved for concepts with insufficient information to code with codable children: Secondary | ICD-10-CM | POA: Insufficient documentation

## 2012-11-01 DIAGNOSIS — M1710 Unilateral primary osteoarthritis, unspecified knee: Secondary | ICD-10-CM | POA: Insufficient documentation

## 2012-11-01 DIAGNOSIS — S46909A Unspecified injury of unspecified muscle, fascia and tendon at shoulder and upper arm level, unspecified arm, initial encounter: Secondary | ICD-10-CM | POA: Insufficient documentation

## 2012-11-01 DIAGNOSIS — Z8701 Personal history of pneumonia (recurrent): Secondary | ICD-10-CM | POA: Insufficient documentation

## 2012-11-01 DIAGNOSIS — S199XXA Unspecified injury of neck, initial encounter: Secondary | ICD-10-CM | POA: Insufficient documentation

## 2012-11-01 DIAGNOSIS — Z8719 Personal history of other diseases of the digestive system: Secondary | ICD-10-CM | POA: Insufficient documentation

## 2012-11-01 DIAGNOSIS — D518 Other vitamin B12 deficiency anemias: Secondary | ICD-10-CM | POA: Insufficient documentation

## 2012-11-01 DIAGNOSIS — R259 Unspecified abnormal involuntary movements: Secondary | ICD-10-CM | POA: Insufficient documentation

## 2012-11-01 DIAGNOSIS — R609 Edema, unspecified: Secondary | ICD-10-CM | POA: Insufficient documentation

## 2012-11-01 DIAGNOSIS — R42 Dizziness and giddiness: Secondary | ICD-10-CM | POA: Insufficient documentation

## 2012-11-01 DIAGNOSIS — Z87891 Personal history of nicotine dependence: Secondary | ICD-10-CM | POA: Insufficient documentation

## 2012-11-01 DIAGNOSIS — W1809XA Striking against other object with subsequent fall, initial encounter: Secondary | ICD-10-CM | POA: Insufficient documentation

## 2012-11-01 DIAGNOSIS — J449 Chronic obstructive pulmonary disease, unspecified: Secondary | ICD-10-CM | POA: Insufficient documentation

## 2012-11-01 DIAGNOSIS — G4700 Insomnia, unspecified: Secondary | ICD-10-CM | POA: Insufficient documentation

## 2012-11-01 DIAGNOSIS — Z79899 Other long term (current) drug therapy: Secondary | ICD-10-CM | POA: Insufficient documentation

## 2012-11-01 DIAGNOSIS — Z8709 Personal history of other diseases of the respiratory system: Secondary | ICD-10-CM | POA: Insufficient documentation

## 2012-11-01 DIAGNOSIS — Z8571 Personal history of Hodgkin lymphoma: Secondary | ICD-10-CM | POA: Insufficient documentation

## 2012-11-01 DIAGNOSIS — S0993XA Unspecified injury of face, initial encounter: Secondary | ICD-10-CM | POA: Insufficient documentation

## 2012-11-01 MED ORDER — ACETAMINOPHEN 325 MG PO TABS
650.0000 mg | ORAL_TABLET | Freq: Once | ORAL | Status: AC
  Administered 2012-11-01: 650 mg via ORAL
  Filled 2012-11-01: qty 2

## 2012-11-01 NOTE — ED Provider Notes (Signed)
I saw and evaluated the patient, reviewed the resident's note and I agree with the findings and plan.   .Face to face Exam:  General:  Awake HEENT:  Atraumatic Resp:  Normal effort Abd:  Nondistended Neuro:No focal weakness Lymph: No adenopathy   Nelia Shi, MD 11/01/12 1146

## 2012-11-01 NOTE — ED Notes (Signed)
Pt from [facility]. Pt fell while he was trying to stomp a bug. Pt fell hitting his head on mattress. Pt denies LOC. Pt initially c/o hip pain, but denies now. Pt just c/o stiff neck. Pt oriented his normal per EMS/Nursing home.

## 2012-11-01 NOTE — ED Notes (Signed)
Pt now reports that his hip "seems to be ok and is not having any pain".

## 2012-11-01 NOTE — ED Notes (Signed)
Pt adds that he is now having R shoulder pain

## 2012-11-01 NOTE — ED Provider Notes (Signed)
History    CSN: [id number] Arrival date & time 11/01/12  0911  First MD Initiated Contact with Patient 11/01/12 [phone]     Chief Complaint  Patient presents with  . Fall   (Consider location/radiation/quality/duration/timing/severity/associated sxs/prior Treatment) HPI Comments: Pt is a 79yo male who presents by EMS transport from [facility] after a fall approx 2 hours ago. Pt was in his room and tried to step on a bug, then lost his balance and fell, striking the back of his head on the floor. He denies LOC, new weakness/numbness anywhere, or open injury/bleeding. He landed on his right side and lay for several minutes "with his head swinging" while his bed-bound roommate called for help. Pt complained of some right hip pain, which has resolved, but has persistent right shoulder and neck pain.  PMH significant for prostate CA, HTN, peripheral neuropathy. Recent admission ~3 months ago for HCAP. Denies current chest pain, SOB, abd pain. No change in bowel/bladder habits. No fevers, N/V. Does endorse some occasional dizziness but is slightly unclear if he had any dizziness this morning or currently. Usually walks with a "scooter" (walker) but states he normally doesn't have trouble with balance.  Patient is a 80 y.o. male presenting with fall. The history is provided by the patient. No language interpreter was used.  Fall This is a new problem. The current episode started today. Associated symptoms include arthralgias and neck pain. Pertinent negatives include no abdominal pain, chest pain, fever, nausea, numbness, rash, vertigo, visual change, vomiting or weakness.   Past Medical History  Diagnosis Date  . ANEMIA, B12 DEFICIENCY 12/06/2006  . INSOMNIA, CHRONIC 05/22/2007  . PERIPHERAL AUTONOMIC NEUROPATHY D/O CLASS ELSW 10/27/2008  . HYPERTENSION 11/26/2006  . COPD 12/06/2006  . Slow transit constipation 05/22/2007  . OSTEOARTHROSIS, LOCAL NOS, LOWER LEG 12/12/2006  . Osteoarth NOS-Unspec 12/06/2006    . EDEMA LEG 09/28/2009  . MASS, LUNG 10/07/2009  . NON-HODGKIN'S LYMPHOMA 09/04/2007  . Prostate ca   . Non hodgkins lymphoma   . Pneumonia    Past Surgical History  Procedure Laterality Date  . Hemorrhoid surgery    .  skin cancers    . Cataract extraction, bilateral     Family History  Problem Relation Age of Onset  . Pneumonia Mother   . Cancer Father   . Breast cancer Daughter   . Testicular cancer Son    History  Substance Use Topics  . Smoking status: Former Smoker    Quit date: 05/01/1950  . Smokeless tobacco: Never Used  . Alcohol Use: No    Review of Systems  Constitutional: Negative for fever and activity change.  HENT: Positive for neck pain.   Cardiovascular: Negative for chest pain and leg swelling.       Varicose veins with occasional ulceration; pt states "they (at the nursing home) keep them wrapped up when they (ulcers) open up and bleed"  Gastrointestinal: Negative for nausea, vomiting and abdominal pain.  Genitourinary: Negative for difficulty urinating.  Musculoskeletal: Positive for arthralgias.  Skin: Negative for rash.  Neurological: Positive for dizziness and tremors. Negative for vertigo, weakness and numbness.       Occasional dizziness but unclear if current; baseline mild tremor of hands    Allergies  Review of patient's allergies indicates no known allergies.  Home Medications   Current Outpatient Rx  Name  Route  Sig  Dispense  Refill  . acetaminophen (TYLENOL) 325 MG tablet   Oral   Take 650  mg by mouth every 6 (six) hours as needed for pain or fever.         Marland Kitchen amoxicillin (AMOXIL) 500 MG capsule   Oral   Take 500 mg by mouth 3 (three) times daily.         Marland Kitchen atorvastatin (LIPITOR) 20 MG tablet   Oral   Take 20 mg by mouth daily.         . diphenhydramine-acetaminophen (TYLENOL PM) 25-500 MG TABS   Oral   Take 1 tablet by mouth at bedtime as needed.         . folic acid (FOLVITE) 1 MG tablet   Oral   Take 1 mg by  mouth daily.         . furosemide (LASIX) 20 MG tablet   Oral   Take 20 mg by mouth every other day.          . gabapentin (NEURONTIN) 100 MG capsule   Oral   Take 100 mg by mouth 3 (three) times daily.         Marland Kitchen guaiFENesin (MUCINEX) 600 MG 12 hr tablet   Oral   Take 1,200 mg by mouth 2 (two) times daily as needed for congestion.         Marland Kitchen ipratropium (ATROVENT) 0.02 % nebulizer solution   Nebulization   Take 500 mcg by nebulization every 8 (eight) hours as needed for wheezing.         Marland Kitchen Leuprolide Acetate, 6 Month, (LUPRON DEPOT) 45 MG injection   Intramuscular   Inject 45 mg into the muscle every 6 (six) months.         Marland Kitchen lisinopril (PRINIVIL,ZESTRIL) 5 MG tablet   Oral   Take 5 mg by mouth daily.         . magnesium hydroxide (MILK OF MAGNESIA) 400 MG/5ML suspension   Oral   Take 30 mLs by mouth daily as needed. constipation         . Melatonin 3 MG CAPS   Oral   Take 1 capsule (3 mg total) by mouth at bedtime.   30 capsule   6   . metoprolol tartrate (LOPRESSOR) 25 MG tablet   Oral   Take 12.5 mg by mouth 2 (two) times daily.         . mirtazapine (REMERON) 15 MG tablet   Oral   Take 7.5 mg by mouth at bedtime.         . Skin Protectants, Misc. (BAZA PROTECT EX)   Apply externally   Apply 1 application topically 3 (three) times daily as needed. Apply to buttocks. carrington moist barrier 61% cream         . tamsulosin (FLOMAX) 0.4 MG CAPS   Oral   Take 0.4 mg by mouth daily.          BP 123/56  Pulse 51  Temp(Src) 98.5 F (36.9 C) (Oral)  Resp 20  SpO2 95% Physical Exam  Nursing note and vitals reviewed. Constitutional: He appears well-developed and well-nourished. No distress.  HENT:  Head: Normocephalic and atraumatic. Head is without raccoon's eyes, without Battle's sign, without abrasion, without contusion and without laceration.  Right Ear: Tympanic membrane normal. No drainage.  Left Ear: No drainage. No hemotympanum.  Decreased hearing is noted.  Nose: No rhinorrhea or nasal deformity.  Mouth/Throat: Uvula is midline and mucous membranes are normal.  No left hemotympanum, right TM not well-visualized due to cerumen.  Eyes: Conjunctivae are normal.  Pupils are equal, round, and reactive to light.  Neck:  Mild tenderness to palpation along paraspinal musculature/down into right shoulder  Cardiovascular: Regular rhythm and normal heart sounds.   No murmur heard. Pulmonary/Chest: Effort normal. No respiratory distress. He has no wheezes. He has no rales.  Abdominal: Soft. Bowel sounds are normal. He exhibits no distension. There is no tenderness.  Musculoskeletal: He exhibits edema.       Right shoulder: He exhibits tenderness. He exhibits no swelling and no effusion.       Right hip: He exhibits tenderness. He exhibits normal range of motion and no deformity.       Cervical back: He exhibits decreased range of motion and tenderness. He exhibits no deformity.       Legs: No pain with passive ROM at bilateral hip joints. Trace edema bilateral LE; right LE with bandage from ankle to knee. Left LE with changes of venous stasis/skin breakdown but no frank induration/drainage/bleeding.  Neurological: He is alert. No cranial nerve deficit. GCS eye subscore is 4. GCS verbal subscore is 5. GCS motor subscore is 6.  Oriented to self, circumstance, place. Some confusion to time (initially stated the date is November 04, 2011). Strength 5/5 in bilateral upper extremities, 4+ but equal in bilateral lower extremities. Mild tremor of bilateral hands at rest.  Skin: Skin is warm and dry.  Psychiatric: He has a normal mood and affect. His behavior is normal.    ED Course  Procedures (including critical care time)  0930: Initial assessment, pt alert/oriented (mild confusion to date), complaining of right neck/shoulder pain but otherwise with grossly normal exam. Will obtain CT head/cervical spine given age and striking head with  residual neck pain.  1100: No fracture or dislocation on cervical spine CT. No acute intracranial abnormality.  Labs Reviewed - No data to display Ct Head Wo Contrast  11/01/2012   *RADIOLOGY REPORT*  Clinical Data: Pain secondary to a fall.  CT HEAD WITHOUT CONTRAST  Technique:  Contiguous axial images were obtained from the base of the skull through the vertex without contrast.  Comparison: CT scan dated 08/31/2011  Findings: There is no acute intracranial hemorrhage, infarction, or mass lesion.  No osseous abnormality.  Slight diffuse cerebral cortical and cerebellar atrophy with secondary ventricular dilatation.  Slight periventricular white matter lucency most prominent in the right frontal lobe, stable and probably representing chronic small vessel ischemic disease.  IMPRESSION: No acute intracranial abnormality.   Original Report Authenticated By: Francene Boyers, M.D.   Ct Cervical Spine Wo Contrast  11/01/2012   *RADIOLOGY REPORT*  Clinical Data: Neck pain secondary to a fall.  CT CERVICAL SPINE WITHOUT CONTRAST  Technique:  Multidetector CT imaging of the cervical spine was performed. Multiplanar CT image reconstructions were also generated.  Comparison: CT scan dated 08/31/2011  Findings: There is no acute fracture or subluxation or prevertebral soft tissue swelling.  Prominent osteophytes are present at multiple levels.  There is prominent calcification in the posterior longitudinal ligament at C4-5 which compresses the spinal cord slightly, unchanged.  There are old calcifications in the nuchal ligament, unchanged.  Hypertrophy and calcification of the transverse ligament at C1-2, unchanged.  IMPRESSION: No acute abnormality of the cervical spine.   Original Report Authenticated By: Francene Boyers, M.D.   1. Fall, initial encounter     MDM  [phone] man presenting to the ED s/p fall with striking head. No LOC or change in mental status, no obvious injury on physical exam. CT  shows no acute  intracranial abnormality or cervical fracture/dislocation. Safe to discharge back to facility. Tylenol PRN for pain.  The above was discussed in its entirety with attending Dr. Radford Pax.   Bobbye Morton, MD  PGY-2, [facility]  Bobbye Morton, MD 11/01/12 [phone]

## 2012-11-01 NOTE — ED Notes (Addendum)
Pt reports that he was trying to step on a bug in his room. Pt states that he does not know what happened but he fell on the floor.Pt denies LOC.  Pt reports R hip pain and neck/upper back pain. Pt A&O. Pt in NAD.

## 2013-07-28 ENCOUNTER — Emergency Department (HOSPITAL_COMMUNITY): Payer: Medicare Other

## 2013-07-28 ENCOUNTER — Emergency Department (HOSPITAL_COMMUNITY)
Admission: EM | Admit: 2013-07-28 | Discharge: 2013-07-28 | Disposition: A | Discharge status: Home / Self Care | Payer: Medicare Other | Attending: Emergency Medicine | Admitting: Emergency Medicine

## 2013-07-28 ENCOUNTER — Encounter (HOSPITAL_COMMUNITY): Payer: Self-pay | Admitting: Emergency Medicine

## 2013-07-28 DIAGNOSIS — Z7982 Long term (current) use of aspirin: Secondary | ICD-10-CM | POA: Insufficient documentation

## 2013-07-28 DIAGNOSIS — J449 Chronic obstructive pulmonary disease, unspecified: Secondary | ICD-10-CM | POA: Insufficient documentation

## 2013-07-28 DIAGNOSIS — D518 Other vitamin B12 deficiency anemias: Secondary | ICD-10-CM | POA: Insufficient documentation

## 2013-07-28 DIAGNOSIS — M1710 Unilateral primary osteoarthritis, unspecified knee: Secondary | ICD-10-CM | POA: Insufficient documentation

## 2013-07-28 DIAGNOSIS — R110 Nausea: Secondary | ICD-10-CM | POA: Insufficient documentation

## 2013-07-28 DIAGNOSIS — Z923 Personal history of irradiation: Secondary | ICD-10-CM | POA: Insufficient documentation

## 2013-07-28 DIAGNOSIS — Z87891 Personal history of nicotine dependence: Secondary | ICD-10-CM | POA: Insufficient documentation

## 2013-07-28 DIAGNOSIS — R3911 Hesitancy of micturition: Secondary | ICD-10-CM | POA: Insufficient documentation

## 2013-07-28 DIAGNOSIS — R109 Unspecified abdominal pain: Secondary | ICD-10-CM | POA: Insufficient documentation

## 2013-07-28 DIAGNOSIS — J4489 Other specified chronic obstructive pulmonary disease: Secondary | ICD-10-CM | POA: Insufficient documentation

## 2013-07-28 DIAGNOSIS — I498 Other specified cardiac arrhythmias: Secondary | ICD-10-CM | POA: Insufficient documentation

## 2013-07-28 DIAGNOSIS — G909 Disorder of the autonomic nervous system, unspecified: Secondary | ICD-10-CM | POA: Insufficient documentation

## 2013-07-28 DIAGNOSIS — Z8546 Personal history of malignant neoplasm of prostate: Secondary | ICD-10-CM | POA: Insufficient documentation

## 2013-07-28 DIAGNOSIS — R34 Anuria and oliguria: Secondary | ICD-10-CM | POA: Insufficient documentation

## 2013-07-28 DIAGNOSIS — R339 Retention of urine, unspecified: Secondary | ICD-10-CM | POA: Insufficient documentation

## 2013-07-28 DIAGNOSIS — Z87898 Personal history of other specified conditions: Secondary | ICD-10-CM | POA: Insufficient documentation

## 2013-07-28 DIAGNOSIS — Z8701 Personal history of pneumonia (recurrent): Secondary | ICD-10-CM | POA: Insufficient documentation

## 2013-07-28 DIAGNOSIS — I10 Essential (primary) hypertension: Secondary | ICD-10-CM | POA: Insufficient documentation

## 2013-07-28 DIAGNOSIS — Z79899 Other long term (current) drug therapy: Secondary | ICD-10-CM | POA: Insufficient documentation

## 2013-07-28 LAB — CBC WITH DIFFERENTIAL/PLATELET
BASOS ABS: 0 10*3/uL (ref 0.0–0.1)
Basophils Relative: 0 % (ref 0–1)
Eosinophils Absolute: 0 10*3/uL (ref 0.0–0.7)
Eosinophils Relative: 0 % (ref 0–5)
HEMATOCRIT: 32.6 % — AB (ref 39.0–52.0)
Hemoglobin: 11 g/dL — ABNORMAL LOW (ref 13.0–17.0)
LYMPHS ABS: 1.1 10*3/uL (ref 0.7–4.0)
LYMPHS PCT: 9 % — AB (ref 12–46)
MCH: 31.6 pg (ref 26.0–34.0)
MCHC: 33.7 g/dL (ref 30.0–36.0)
MCV: 93.7 fL (ref 78.0–100.0)
Monocytes Absolute: 1.1 10*3/uL — ABNORMAL HIGH (ref 0.1–1.0)
Monocytes Relative: 9 % (ref 3–12)
NEUTROS ABS: 10.3 10*3/uL — AB (ref 1.7–7.7)
Neutrophils Relative %: 82 % — ABNORMAL HIGH (ref 43–77)
PLATELETS: 224 10*3/uL (ref 150–400)
RBC: 3.48 MIL/uL — AB (ref 4.22–5.81)
RDW: 13.3 % (ref 11.5–15.5)
WBC: 12.6 10*3/uL — AB (ref 4.0–10.5)

## 2013-07-28 LAB — BASIC METABOLIC PANEL
BUN: 40 mg/dL — ABNORMAL HIGH (ref 6–23)
CHLORIDE: 101 meq/L (ref 96–112)
CO2: 20 meq/L (ref 19–32)
CREATININE: 1.82 mg/dL — AB (ref 0.50–1.35)
Calcium: 9.5 mg/dL (ref 8.4–10.5)
GFR calc non Af Amer: 29 mL/min — ABNORMAL LOW (ref >90)
GFR, EST AFRICAN AMERICAN: 34 mL/min — AB (ref >90)
Glucose, Bld: 136 mg/dL — ABNORMAL HIGH (ref 70–99)
Potassium: 5.2 mEq/L (ref 3.7–5.3)
SODIUM: 134 meq/L — AB (ref 137–147)

## 2013-07-28 LAB — URINE MICROSCOPIC-ADD ON

## 2013-07-28 LAB — URINALYSIS, ROUTINE W REFLEX MICROSCOPIC
Bilirubin Urine: NEGATIVE
GLUCOSE, UA: NEGATIVE mg/dL
KETONES UR: NEGATIVE mg/dL
Leukocytes, UA: NEGATIVE
Nitrite: NEGATIVE
PROTEIN: NEGATIVE mg/dL
Specific Gravity, Urine: 1.012 (ref 1.005–1.030)
Urobilinogen, UA: 0.2 mg/dL (ref 0.0–1.0)
pH: 5 (ref 5.0–8.0)

## 2013-07-28 MED ORDER — IOHEXOL 300 MG/ML  SOLN
50.0000 mL | Freq: Once | INTRAMUSCULAR | Status: AC | PRN
  Administered 2013-07-28: 50 mL via ORAL

## 2013-07-28 NOTE — Discharge Instructions (Signed)
Acute Urinary Retention, Male °Acute urinary retention is the temporary inability to urinate. °This is a common problem in older men. As men age their prostates become larger and block the flow of urine from the bladder. This is usually a problem that has come on gradually.  °HOME CARE INSTRUCTIONS °If you are sent home with a Foley catheter and a drainage system, you will need to discuss the best course of action with your health care provider. While the catheter is in, maintain a good intake of fluids. Keep the drainage bag emptied and lower than your catheter. This is so that contaminated urine will not flow back into your bladder, which could lead to a urinary tract infection. °There are two main types of drainage bags. One is a large bag that usually is used at night. It has a good capacity that will allow you to sleep through the night without having to empty it. The second type is called a leg bag. It has a smaller capacity, so it needs to be emptied more frequently. However, the main advantage is that it can be attached by a leg strap and can go underneath your clothing, allowing you the freedom to move about or leave your home. °Only take over-the-counter or prescription medicines for pain, discomfort, or fever as directed by your health care provider.  °SEEK MEDICAL CARE IF: °· You develop a low-grade fever. °· You experience spasms or leakage of urine with the spasms. °SEEK IMMEDIATE MEDICAL CARE IF:  °· You develop chills or fever. °· Your catheter stops draining urine. °· Your catheter falls out. °· You start to develop increased bleeding that does not respond to rest and increased fluid intake. °MAKE SURE YOU: °· Understand these instructions. °· Will watch your condition. °· Will get help right away if you are not doing well or get worse. °Document Released: 07/24/2000 Document Revised: 12/18/2012 Document Reviewed: 09/26/2012 °ExitCare® Patient Information ©2014 [employer]. ° °

## 2013-07-28 NOTE — ED Provider Notes (Signed)
CSN: [id number]     Arrival date & time 07/28/13  0801 History   First MD Initiated Contact with Patient 07/28/13 0805     Chief Complaint  Patient presents with  . Urinary Retention     (Consider location/radiation/quality/duration/timing/severity/associated sxs/prior Treatment) HPI Comments: Pt reports decreased urination for about 1 week, only dribbling since yesterday and now no UOP since last night. He has lower abdominal pain.   Patient is a 80 y.o. male presenting with male genitourinary complaint. The history is provided by the patient. No language interpreter was used.  Male GU Problem Presenting symptoms: no dysuria   Presenting symptoms comment:  Urinary retention  Context: spontaneously   Relieved by:  Nothing Worsened by:  Nothing tried Ineffective treatments:  None tried Associated symptoms: abdominal pain, nausea, urinary hesitation and urinary retention   Associated symptoms: no diarrhea, no fever, no flank pain, no groin pain, no hematuria, no penile redness, no penile swelling, no scrotal swelling and no vomiting   Abdominal pain:    Location:  Suprapubic   Quality:  Aching   Severity:  Moderate   Onset quality:  Gradual   Duration:  1 day   Timing:  Constant   Progression:  Unchanged Risk factors comment:  Prostate cancer   Past Medical History  Diagnosis Date  . ANEMIA, B12 DEFICIENCY 12/06/2006  . INSOMNIA, CHRONIC 05/22/2007  . PERIPHERAL AUTONOMIC NEUROPATHY D/O CLASS ELSW 10/27/2008  . HYPERTENSION 11/26/2006  . COPD 12/06/2006  . Slow transit constipation 05/22/2007  . OSTEOARTHROSIS, LOCAL NOS, LOWER LEG 12/12/2006  . Osteoarth NOS-Unspec 12/06/2006  . EDEMA LEG 09/28/2009  . MASS, LUNG 10/07/2009  . NON-HODGKIN'S LYMPHOMA 09/04/2007  . Prostate ca   . Non hodgkins lymphoma   . Pneumonia    Past Surgical History  Procedure Laterality Date  . Hemorrhoid surgery    .  skin cancers    . Cataract extraction, bilateral     Family History  Problem  Relation Age of Onset  . Pneumonia Mother   . Cancer Father   . Breast cancer Daughter   . Testicular cancer Son    History  Substance Use Topics  . Smoking status: Former Smoker    Quit date: 05/01/1950  . Smokeless tobacco: Never Used  . Alcohol Use: No    Review of Systems  Constitutional: Negative for fever, activity change, appetite change and fatigue.  HENT: Negative for congestion, facial swelling, rhinorrhea and trouble swallowing.   Eyes: Negative for photophobia and pain.  Respiratory: Negative for cough, chest tightness and shortness of breath.   Cardiovascular: Negative for chest pain and leg swelling.  Gastrointestinal: Positive for nausea and abdominal pain. Negative for vomiting, diarrhea and constipation.  Endocrine: Negative for polydipsia and polyuria.  Genitourinary: Positive for hesitancy, decreased urine volume and difficulty urinating. Negative for dysuria, urgency, hematuria, flank pain, penile swelling and scrotal swelling.  Musculoskeletal: Negative for back pain and gait problem.  Skin: Negative for color change, rash and wound.  Allergic/Immunologic: Negative for immunocompromised state.  Neurological: Negative for dizziness, facial asymmetry, speech difficulty, weakness, numbness and headaches.  Psychiatric/Behavioral: Negative for confusion, decreased concentration and agitation.      Allergies  Review of patient's allergies indicates no known allergies.  Home Medications   Current Outpatient Rx  Name  Route  Sig  Dispense  Refill  . acetaminophen (TYLENOL) 500 MG tablet   Oral   Take 500 mg by mouth 3 (three) times daily.         Marland Kitchen  aspirin 81 MG chewable tablet   Oral   Chew 81 mg by mouth daily.         Marland Kitchen atorvastatin (LIPITOR) 10 MG tablet   Oral   Take 5 mg by mouth every evening.         . furosemide (LASIX) 20 MG tablet   Oral   Take 20 mg by mouth every other day.          . gabapentin (NEURONTIN) 100 MG capsule    Oral   Take 100 mg by mouth 3 (three) times daily.         Marland Kitchen guaiFENesin (MUCINEX) 600 MG 12 hr tablet   Oral   Take 1,200 mg by mouth 2 (two) times daily as needed for congestion.         Marland Kitchen ipratropium (ATROVENT) 0.02 % nebulizer solution   Nebulization   Take 500 mcg by nebulization every 8 (eight) hours as needed for wheezing.         Marland Kitchen Leuprolide Acetate, 6 Month, (LUPRON DEPOT) 45 MG injection   Intramuscular   Inject 45 mg into the muscle every 6 (six) months.         Marland Kitchen lisinopril (PRINIVIL,ZESTRIL) 5 MG tablet   Oral   Take 5 mg by mouth daily.         Marland Kitchen loratadine (CLARITIN) 10 MG tablet   Oral   Take 10 mg by mouth daily.         . magnesium hydroxide (MILK OF MAGNESIA) 400 MG/5ML suspension   Oral   Take 30 mLs by mouth daily as needed. constipation         . Melatonin 3 MG CAPS   Oral   Take 1 capsule (3 mg total) by mouth at bedtime.   30 capsule   6   . Multiple Vitamins-Iron (MULTIVITAMINS WITH IRON) TABS tablet   Oral   Take 1 tablet by mouth daily.         Marland Kitchen nystatin cream (MYCOSTATIN)   Topical   Apply 1 application topically 2 (two) times daily as needed for dry skin (to groin area).         . protein supplement (RESOURCE BENEPROTEIN) POWD   Oral   Take 6 g by mouth 2 (two) times daily.         . Skin Protectants, Misc. (BAZA PROTECT EX)   Apply externally   Apply 1 application topically 3 (three) times daily as needed. Apply to buttocks. carrington moist barrier 61% cream         . tamsulosin (FLOMAX) 0.4 MG CAPS   Oral   Take 0.4 mg by mouth daily.          BP 141/69  Pulse 59  Temp(Src) 97.8 F (36.6 C) (Oral)  Resp 16  SpO2 98% Physical Exam  Constitutional: He is oriented to person, place, and time. He appears well-developed and well-nourished. No distress.  HENT:  Head: Normocephalic and atraumatic.  Mouth/Throat: No oropharyngeal exudate.  Eyes: Pupils are equal, round, and reactive to light.  Neck:  Normal range of motion. Neck supple.  Cardiovascular: Normal rate, regular rhythm and normal heart sounds.  Exam reveals no gallop and no friction rub.   No murmur heard. Pulmonary/Chest: Effort normal and breath sounds normal. No respiratory distress. He has no wheezes. He has no rales.  Abdominal: Soft. Bowel sounds are normal. He exhibits no distension and no mass. There is tenderness in the suprapubic  area. There is no rebound and no guarding.    Distended, firm bladder   Musculoskeletal: Normal range of motion. He exhibits no edema and no tenderness.  Neurological: He is alert and oriented to person, place, and time.  Skin: Skin is warm and dry.  Psychiatric: He has a normal mood and affect.    ED Course  Procedures (including critical care time) Labs Review Labs Reviewed  BASIC METABOLIC PANEL - Abnormal; Notable for the following:    Sodium 134 (*)    Glucose, Bld 136 (*)    BUN 40 (*)    Creatinine, Ser 1.82 (*)    GFR calc non Af Amer 29 (*)    GFR calc Af Amer 34 (*)    All other components within normal limits  CBC WITH DIFFERENTIAL - Abnormal; Notable for the following:    WBC 12.6 (*)    RBC 3.48 (*)    Hemoglobin 11.0 (*)    HCT 32.6 (*)    Neutrophils Relative % 82 (*)    Neutro Abs 10.3 (*)    Lymphocytes Relative 9 (*)    Monocytes Absolute 1.1 (*)    All other components within normal limits  URINALYSIS, ROUTINE W REFLEX MICROSCOPIC - Abnormal; Notable for the following:    Hgb urine dipstick MODERATE (*)    All other components within normal limits  URINE CULTURE  URINE MICROSCOPIC-ADD ON   Imaging Review Ct Abdomen Pelvis Wo Contrast  07/28/2013   CLINICAL DATA:  Dysuria and back pain.  EXAM: CT ABDOMEN AND PELVIS WITHOUT CONTRAST  TECHNIQUE: Multidetector CT imaging of the abdomen and pelvis was performed following the standard protocol without intravenous contrast.  COMPARISON:  CT abdomen and pelvis 08/15/2007.  FINDINGS: Heart size is mildly  enlarged. There is some dependent atelectatic change. No pleural or pericardial effusion.  No renal or ureteral stones are identified. Low attenuating lesion in the left kidney compatible with a cyst is unchanged. Stones are seen within the gallbladder is on the prior study but there is no CT evidence of cholecystitis. The liver, spleen and adrenal glands appear normal.  Left periaortic lymph node which had measured 2.6 x 3.8 cm on the most recent CT scan today measures 2.0 x 1.9 cm on image 41. No new or enlarging lymph nodes are identified. Retroaortic left renal vein is noted.  The prostate gland is enlarged. There is a Foley catheter in the urinary bladder. Air in the bladder is likely related to Foley placement. The stomach and small and large bowel appear normal. No fluid collection is identified. No lytic or sclerotic bony lesion is seen with multilevel lumbar degenerative change which appears mild noted.  IMPRESSION: Negative for urinary tract stone or hydronephrosis.  Marked enlargement of the prostate gland. Prostate size appears increased since the prior CT.  Mildly enlarged left periaortic lymph node is decreased in size since the prior CT.  Gallstones without evidence of cholecystitis.   Electronically Signed   By: Inge Rise M.D.   On: 07/28/2013 13:27     EKG Interpretation None      MDM   Final diagnoses:  Urinary retention    Pt is a 80 y.o. male with Pmhx as above who presents with urinary retention. Pt reports about 1 week of dec UOP, inc straining w/ urination, yesterday w/ only dribbling, and no UOP since last night. He has suprapubic pain, has had nausea, no vomiting, CP, SOB fever, or inc is his chronic  LE edema. He has chronic constipation which is unchanged. On hx of similar, but reports having radiation for prostate cancer several years ago. On PE, Pt mildly bradycardic, VS otherwise stable, in NAD. He has largely distended bladder on PE, GU exam nml. Will place foley &  reexamine abdomen. CBC, BMP, UA grossly unremarkable except for Hb in urine. Cr mildly elevated but at baseline.  On repeat exam, pt feeling improved, still has palpable bladder w/ mild pain on palpation. Daughter has arrived and reports he said he had intermittent, severe abdominal pains yesterday.  Will get ST stone study.    CT w/o stones, showed enlarged prostate.  Will leave foley in placed w/ plan for outpt urology f/u. He feels much improved.  Return precautions given for new or worsening symptoms including fever, chills, n/v, worsening pain.          Neta Ehlers, MD 07/28/13 2151

## 2013-07-28 NOTE — ED Notes (Signed)
Patient states feels much better since foley placed

## 2013-07-28 NOTE — ED Notes (Signed)
Per EMS, states urinary retention for 2 days-has history of prostate cancer

## 2013-07-28 NOTE — ED Notes (Signed)
Off floor for testing 

## 2013-07-28 NOTE — ED Notes (Signed)
Bed: ES92 Expected date:  Expected time:  Means of arrival:  Comments: EMS- elderly, urinary retention

## 2013-07-29 LAB — URINE CULTURE
CULTURE: NO GROWTH
Colony Count: NO GROWTH

## 2013-08-31 ENCOUNTER — Encounter (HOSPITAL_COMMUNITY): Payer: Self-pay | Admitting: Emergency Medicine

## 2013-08-31 ENCOUNTER — Emergency Department (HOSPITAL_COMMUNITY)
Admission: EM | Admit: 2013-08-31 | Discharge: 2013-08-31 | Disposition: A | Discharge status: Home / Self Care | Payer: Medicare Other | Attending: Emergency Medicine | Admitting: Emergency Medicine

## 2013-08-31 DIAGNOSIS — Z8546 Personal history of malignant neoplasm of prostate: Secondary | ICD-10-CM | POA: Insufficient documentation

## 2013-08-31 DIAGNOSIS — Z8701 Personal history of pneumonia (recurrent): Secondary | ICD-10-CM | POA: Insufficient documentation

## 2013-08-31 DIAGNOSIS — Z8669 Personal history of other diseases of the nervous system and sense organs: Secondary | ICD-10-CM | POA: Insufficient documentation

## 2013-08-31 DIAGNOSIS — J4489 Other specified chronic obstructive pulmonary disease: Secondary | ICD-10-CM | POA: Insufficient documentation

## 2013-08-31 DIAGNOSIS — R141 Gas pain: Secondary | ICD-10-CM | POA: Insufficient documentation

## 2013-08-31 DIAGNOSIS — Z79899 Other long term (current) drug therapy: Secondary | ICD-10-CM | POA: Insufficient documentation

## 2013-08-31 DIAGNOSIS — Z7982 Long term (current) use of aspirin: Secondary | ICD-10-CM | POA: Insufficient documentation

## 2013-08-31 DIAGNOSIS — F0390 Unspecified dementia without behavioral disturbance: Secondary | ICD-10-CM | POA: Insufficient documentation

## 2013-08-31 DIAGNOSIS — M1710 Unilateral primary osteoarthritis, unspecified knee: Secondary | ICD-10-CM | POA: Insufficient documentation

## 2013-08-31 DIAGNOSIS — R143 Flatulence: Secondary | ICD-10-CM

## 2013-08-31 DIAGNOSIS — N390 Urinary tract infection, site not specified: Secondary | ICD-10-CM

## 2013-08-31 DIAGNOSIS — R142 Eructation: Secondary | ICD-10-CM

## 2013-08-31 DIAGNOSIS — Y846 Urinary catheterization as the cause of abnormal reaction of the patient, or of later complication, without mention of misadventure at the time of the procedure: Secondary | ICD-10-CM | POA: Insufficient documentation

## 2013-08-31 DIAGNOSIS — I10 Essential (primary) hypertension: Secondary | ICD-10-CM | POA: Insufficient documentation

## 2013-08-31 DIAGNOSIS — Z8571 Personal history of Hodgkin lymphoma: Secondary | ICD-10-CM | POA: Insufficient documentation

## 2013-08-31 DIAGNOSIS — D649 Anemia, unspecified: Secondary | ICD-10-CM | POA: Insufficient documentation

## 2013-08-31 DIAGNOSIS — Z87891 Personal history of nicotine dependence: Secondary | ICD-10-CM | POA: Insufficient documentation

## 2013-08-31 DIAGNOSIS — Z8639 Personal history of other endocrine, nutritional and metabolic disease: Secondary | ICD-10-CM | POA: Insufficient documentation

## 2013-08-31 DIAGNOSIS — Z862 Personal history of diseases of the blood and blood-forming organs and certain disorders involving the immune mechanism: Secondary | ICD-10-CM | POA: Insufficient documentation

## 2013-08-31 DIAGNOSIS — J449 Chronic obstructive pulmonary disease, unspecified: Secondary | ICD-10-CM | POA: Insufficient documentation

## 2013-08-31 DIAGNOSIS — T83091A Other mechanical complication of indwelling urethral catheter, initial encounter: Secondary | ICD-10-CM | POA: Insufficient documentation

## 2013-08-31 LAB — URINALYSIS, ROUTINE W REFLEX MICROSCOPIC
Bilirubin Urine: NEGATIVE
GLUCOSE, UA: NEGATIVE mg/dL
Ketones, ur: NEGATIVE mg/dL
Nitrite: NEGATIVE
PROTEIN: 30 mg/dL — AB
Specific Gravity, Urine: 1.012 (ref 1.005–1.030)
Urobilinogen, UA: 0.2 mg/dL (ref 0.0–1.0)
pH: 8 (ref 5.0–8.0)

## 2013-08-31 LAB — URINE MICROSCOPIC-ADD ON

## 2013-08-31 MED ORDER — CEPHALEXIN 500 MG PO CAPS
500.0000 mg | ORAL_CAPSULE | Freq: Once | ORAL | Status: AC
  Administered 2013-08-31: 500 mg via ORAL
  Filled 2013-08-31: qty 1

## 2013-08-31 MED ORDER — CEPHALEXIN 500 MG PO CAPS: 500.0000 mg | ORAL_CAPSULE | Freq: Once | ORAL | Status: DC

## 2013-08-31 MED ORDER — CEPHALEXIN 500 MG PO CAPS: 500.0000 mg | ORAL_CAPSULE | Freq: Four times a day (QID) | ORAL | Status: AC

## 2013-08-31 NOTE — ED Provider Notes (Signed)
Medical screening examination/treatment/procedure(s) were conducted as a shared visit with non-physician practitioner(s) and myself.  I personally evaluated the patient during the encounter.   EKG Interpretation None      Presented to the ER with abdominal pain felt secondary to acute urinary retention. Patient has an indwelling Foley catheter which became clogged. He had approximately a liter of urine in his bladder. This was drained and he had complete resolution of his symptoms. Foley catheter was replaced. Followup with primary care.  Orpah Greek, MD 08/31/13 1134

## 2013-08-31 NOTE — ED Notes (Signed)
Per EMS pt comes from nursing facility. Pt dementia and has recent prostate cancer diagnosis and had foley catheter placed to help with voiding.  Pt c/o abd pain. Daughter at bedside per EMS. Pt is unaware of prostate cancer diagnoses.

## 2013-08-31 NOTE — ED Notes (Addendum)
Pt comes from [facility] facility. Pt's daughter states that the foley cath was scheduled to be changed on 08/28/13 but wasn't.

## 2013-08-31 NOTE — ED Notes (Signed)
Bed: WA09 Expected date:  Expected time:  Means of arrival:  Comments: 

## 2013-08-31 NOTE — ED Provider Notes (Signed)
CSN: [id number]     Arrival date & time 08/31/13  1008 History   First MD Initiated Contact with Patient 08/31/13 1013     Chief Complaint  Patient presents with  . Abdominal Pain     (Consider location/radiation/quality/duration/timing/severity/associated sxs/prior Treatment) HPI History provided by pt and his daughter.  Pt reports severe lower abdominal pain since going to bed last night.  Associated w/ anuria.  Denies fever, N/V, change in bowels.  Pt has dementia and resides at nursing home.  His daughter is present and reports that he had foley catheter placed one month ago d/t enlarged prostate.  Has not had any catheter complications until now.   Past Medical History  Diagnosis Date  . ANEMIA, B12 DEFICIENCY 12/06/2006  . INSOMNIA, CHRONIC 05/22/2007  . PERIPHERAL AUTONOMIC NEUROPATHY D/O CLASS ELSW 10/27/2008  . HYPERTENSION 11/26/2006  . COPD 12/06/2006  . Slow transit constipation 05/22/2007  . OSTEOARTHROSIS, LOCAL NOS, LOWER LEG 12/12/2006  . Osteoarth NOS-Unspec 12/06/2006  . EDEMA LEG 09/28/2009  . MASS, LUNG 10/07/2009  . NON-HODGKIN'S LYMPHOMA 09/04/2007  . Prostate ca   . Non hodgkins lymphoma   . Pneumonia    Past Surgical History  Procedure Laterality Date  . Hemorrhoid surgery    .  skin cancers    . Cataract extraction, bilateral     Family History  Problem Relation Age of Onset  . Pneumonia Mother   . Cancer Father   . Breast cancer Daughter   . Testicular cancer Son    History  Substance Use Topics  . Smoking status: Former Smoker    Quit date: 05/01/1950  . Smokeless tobacco: Never Used  . Alcohol Use: No    Review of Systems  All other systems reviewed and are negative.     Allergies  Review of patient's allergies indicates no known allergies.  Home Medications   Prior to Admission medications   Medication Sig Start Date End Date Taking? Authorizing Provider  acetaminophen (TYLENOL) 500 MG tablet Take 500 mg by mouth 3 (three) times daily.     Historical Provider, MD  aspirin 81 MG chewable tablet Chew 81 mg by mouth daily.    Historical Provider, MD  atorvastatin (LIPITOR) 10 MG tablet Take 5 mg by mouth every evening.    Historical Provider, MD  furosemide (LASIX) 20 MG tablet Take 20 mg by mouth every other day.     Historical Provider, MD  gabapentin (NEURONTIN) 100 MG capsule Take 100 mg by mouth 3 (three) times daily. 08/09/12   Domenic Polite, MD  guaiFENesin (MUCINEX) 600 MG 12 hr tablet Take 1,200 mg by mouth 2 (two) times daily as needed for congestion. 08/09/12   Domenic Polite, MD  ipratropium (ATROVENT) 0.02 % nebulizer solution Take 500 mcg by nebulization every 8 (eight) hours as needed for wheezing.    Historical Provider, MD  Leuprolide Acetate, 6 Month, (LUPRON DEPOT) 45 MG injection Inject 45 mg into the muscle every 6 (six) months.    Historical Provider, MD  lisinopril (PRINIVIL,ZESTRIL) 5 MG tablet Take 5 mg by mouth daily.    Historical Provider, MD  loratadine (CLARITIN) 10 MG tablet Take 10 mg by mouth daily.    Historical Provider, MD  magnesium hydroxide (MILK OF MAGNESIA) 400 MG/5ML suspension Take 30 mLs by mouth daily as needed. constipation    Historical Provider, MD  Melatonin 3 MG CAPS Take 1 capsule (3 mg total) by mouth at bedtime. 06/05/11   Ricard Dillon,  MD  Multiple Vitamins-Iron (MULTIVITAMINS WITH IRON) TABS tablet Take 1 tablet by mouth daily.    Historical Provider, MD  nystatin cream (MYCOSTATIN) Apply 1 application topically 2 (two) times daily as needed for dry skin (to groin area).    Historical Provider, MD  protein supplement (RESOURCE BENEPROTEIN) POWD Take 6 g by mouth 2 (two) times daily.    Historical Provider, MD  Skin Protectants, Misc. (BAZA PROTECT EX) Apply 1 application topically 3 (three) times daily as needed. Apply to buttocks. carrington moist barrier 61% cream    Historical Provider, MD  tamsulosin (FLOMAX) 0.4 MG CAPS Take 0.4 mg by mouth daily. 07/15/12   Ricard Dillon, MD    There were no vitals taken for this visit. Physical Exam  Nursing note and vitals reviewed. Constitutional: He is oriented to person, place, and time. He appears well-developed and well-nourished. No distress.  HENT:  Head: Normocephalic and atraumatic.  Eyes:  Normal appearance  Neck: Normal range of motion.  Cardiovascular: Normal rate and regular rhythm.   Pulmonary/Chest: Effort normal and breath sounds normal. No respiratory distress.  Abdominal: Soft. Bowel sounds are normal. He exhibits no distension and no mass. There is no rebound and no guarding.  Mild distension.  Diffuse tenderness, worst across lower abdomen.  Genitourinary:  Foley catheter in place.  There is a small amt of foul-smelling urine leaking out around catheter.  No more than 20-30cc urine in leg bag.    Musculoskeletal: Normal range of motion.  Neurological: He is alert and oriented to person, place, and time.  Skin: Skin is warm and dry. No rash noted.  Psychiatric: He has a normal mood and affect. His behavior is normal.    ED Course  Procedures (including critical care time) Labs Review Labs Reviewed  URINALYSIS, ROUTINE W REFLEX MICROSCOPIC    Imaging Review No results found.   EKG Interpretation None      MDM   Final diagnoses:  Obstruction of Foley catheter  Urinary tract infection    80yo M from SNF w/ dementia and prostate cancer w/ chronic foley catheter, presents w/ urinary retention and lower abd pain since last night.  No exam, afebrile, NAD, abd mildly distended and diffuse ttp, worst across lower abd, foley catheter in place, sm amt foul-smelling urine leaking around catheter and 20-30cc urine in leg bag.  U/A pending.  Nursing staff to irrigate. 10:38 AM   Cath replaced and pt voided >0.5L urine.  Sx resolved and abd tenderness improved.  U/A positive for infection.  Culture sent.  Pt received first dose of keflex and d/c'd home w/ same.  Return precautions discussed.  11:30 AM      Remer Macho, PA-C 08/31/13 1132

## 2013-08-31 NOTE — Discharge Instructions (Signed)
Take antibiotic as prescribed.  Follow up with your urologist.  You should return to the ER if you develop fever, severe abdomen or low back pain or uncontrolled vomiting.

## 2013-09-01 LAB — URINE CULTURE: Colony Count: >=100000

## 2013-10-07 ENCOUNTER — Telehealth: Payer: Self-pay

## 2013-10-07 NOTE — Telephone Encounter (Signed)
Patient past away per Obituary  °

## 2013-10-29 DEATH — deceased
